# Patient Record
Sex: Female | Born: 1985 | Race: Black or African American | Hispanic: No | Marital: Single | Smoking: Never smoker
Health system: Southern US, Community
[De-identification: ages and names within clinical notes are randomized; demographics above are authoritative.]

## PROBLEM LIST (undated history)

## (undated) ENCOUNTER — Emergency Department (HOSPITAL_COMMUNITY): Admission: EM | Payer: Self-pay | Source: Home / Self Care

## (undated) DIAGNOSIS — G43909 Migraine, unspecified, not intractable, without status migrainosus: Secondary | ICD-10-CM

## (undated) DIAGNOSIS — T7840XA Allergy, unspecified, initial encounter: Secondary | ICD-10-CM

## (undated) DIAGNOSIS — L0291 Cutaneous abscess, unspecified: Secondary | ICD-10-CM

## (undated) HISTORY — PX: WISDOM TOOTH EXTRACTION: SHX21

## (undated) HISTORY — DX: Migraine, unspecified, not intractable, without status migrainosus: G43.909

## (undated) HISTORY — DX: Allergy, unspecified, initial encounter: T78.40XA

---

## 2006-03-30 ENCOUNTER — Emergency Department (HOSPITAL_COMMUNITY): Admission: EM | Admit: 2006-03-30 | Discharge: 2006-03-30 | Payer: Self-pay | Admitting: Family Medicine

## 2010-04-05 ENCOUNTER — Emergency Department (HOSPITAL_COMMUNITY): Admission: EM | Admit: 2010-04-05 | Discharge: 2010-04-05 | Payer: Self-pay | Admitting: Emergency Medicine

## 2011-01-24 LAB — URINALYSIS, ROUTINE W REFLEX MICROSCOPIC
Bilirubin Urine: NEGATIVE
Nitrite: NEGATIVE

## 2011-01-24 LAB — WET PREP, GENITAL
Trich, Wet Prep: NONE SEEN
Yeast Wet Prep HPF POC: NONE SEEN

## 2011-01-24 LAB — POCT PREGNANCY, URINE: Preg Test, Ur: NEGATIVE

## 2011-05-10 IMAGING — US US PELVIS COMPLETE
1 series · 14 of 25 positions shown · non-contrast
Comparison: None

CLINICAL DATA: Nausea, vomiting, lower abdominal pain.

TRANSABDOMINAL ULTRASOUND OF PELVIS
TECHNIQUE: Transabdominal ultrasound examination of the pelvis was
performed including evaluation of the uterus, ovaries, adnexal
regions, and pelvic cul-de-sac.

[Series 1: us pelvis complete · 0.23mm/px · 14 of 36 slices shown]
[im 1/36]
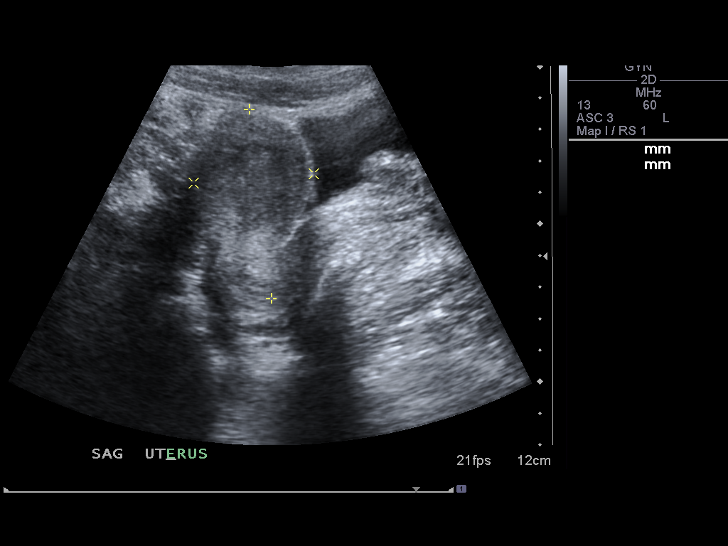
[im 3/36]
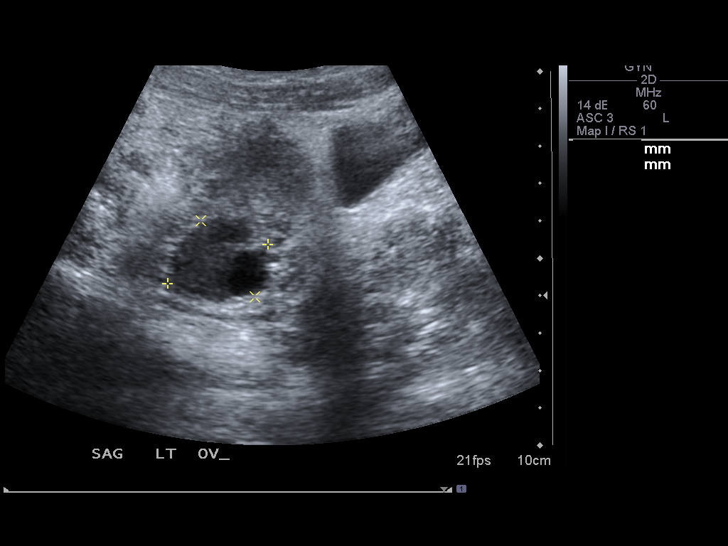
[im 6/36]
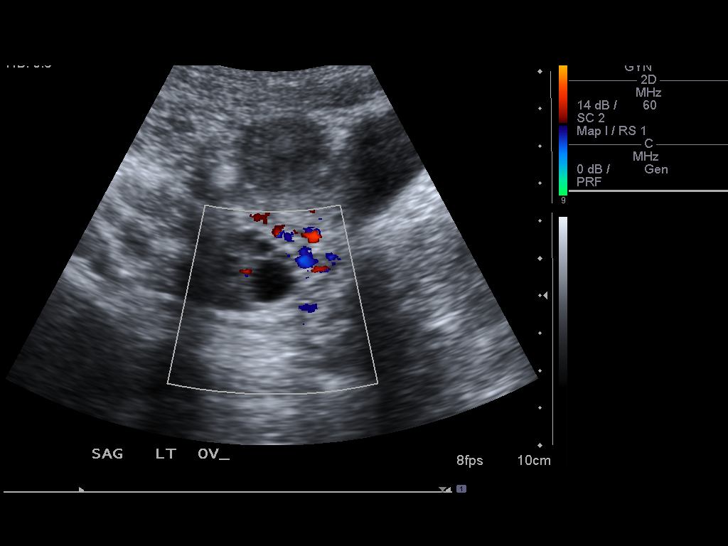
[im 9/36]
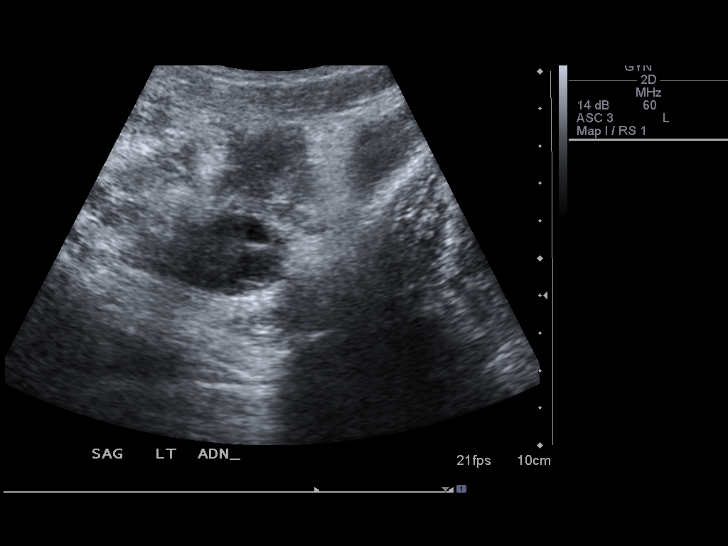
[im 12/36]
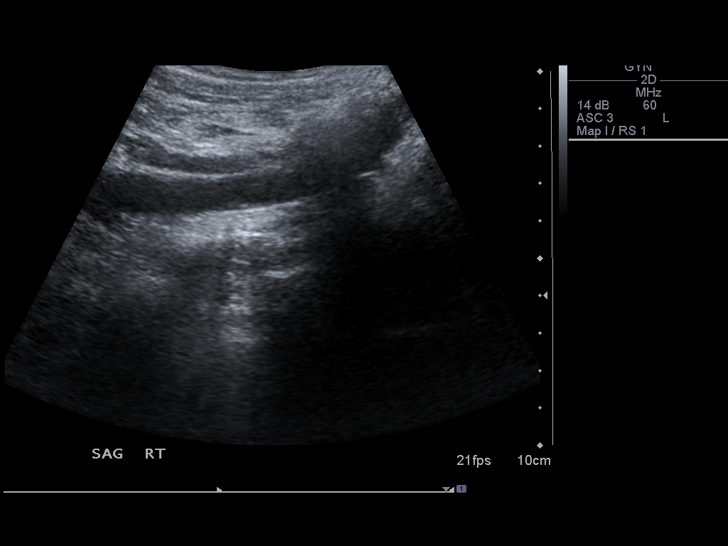
[im 14/36]
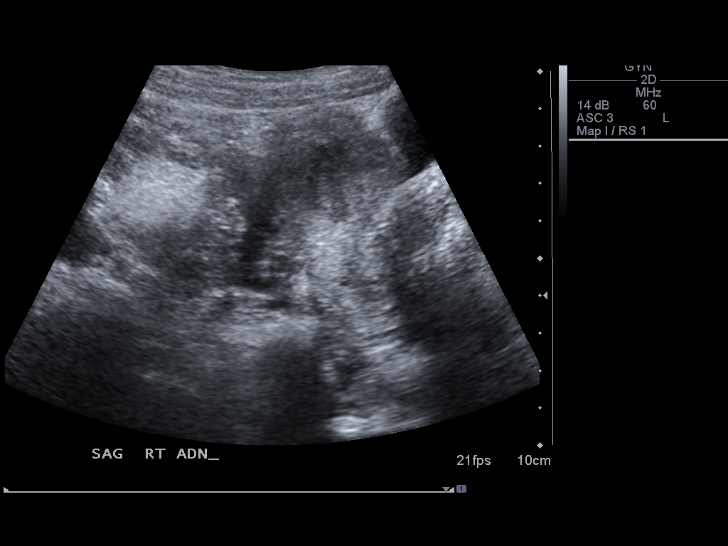
[im 17/36]
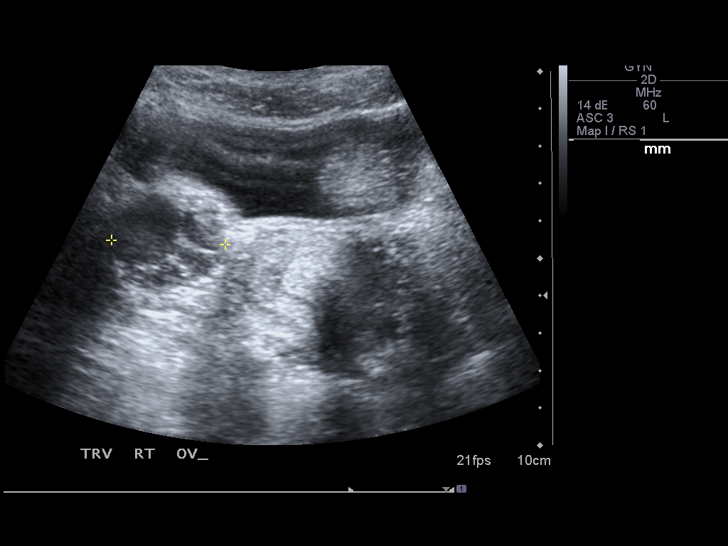
[im 19/36]
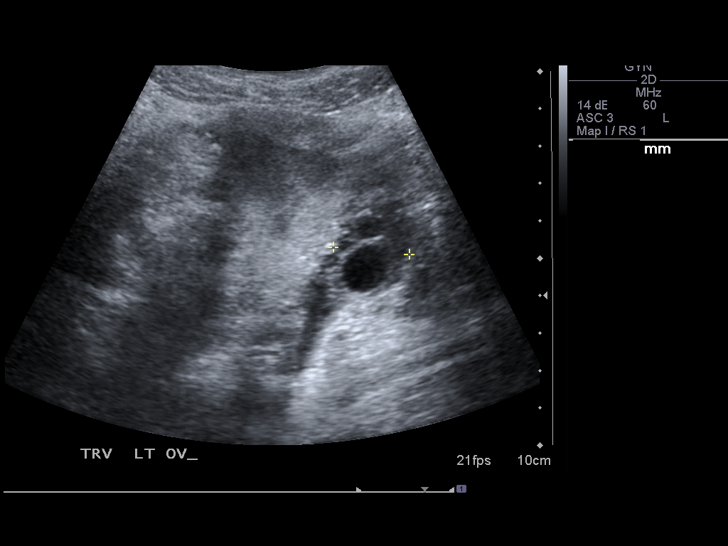
[im 22/36]
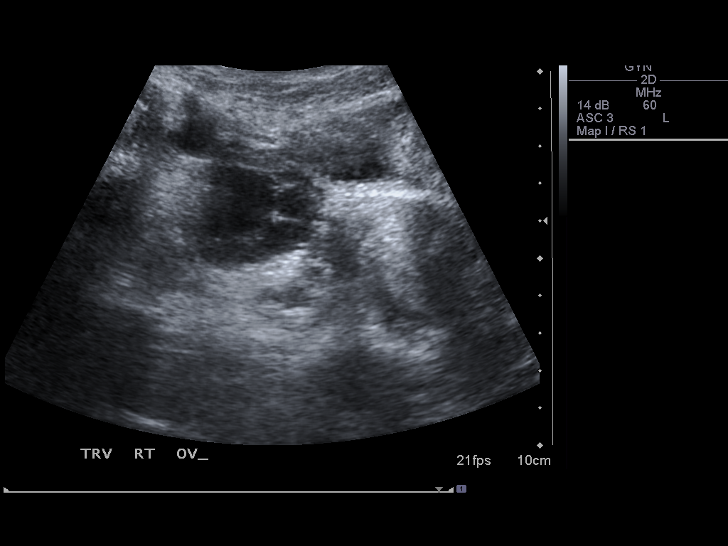
[im 24/36]
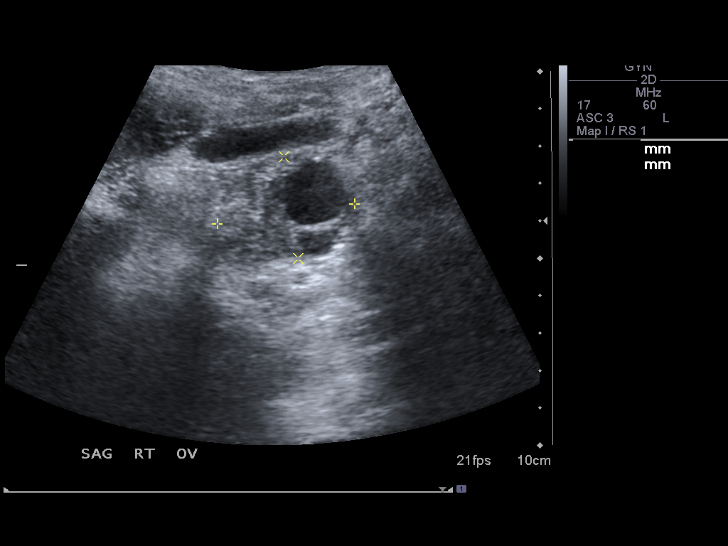
[im 27/36]
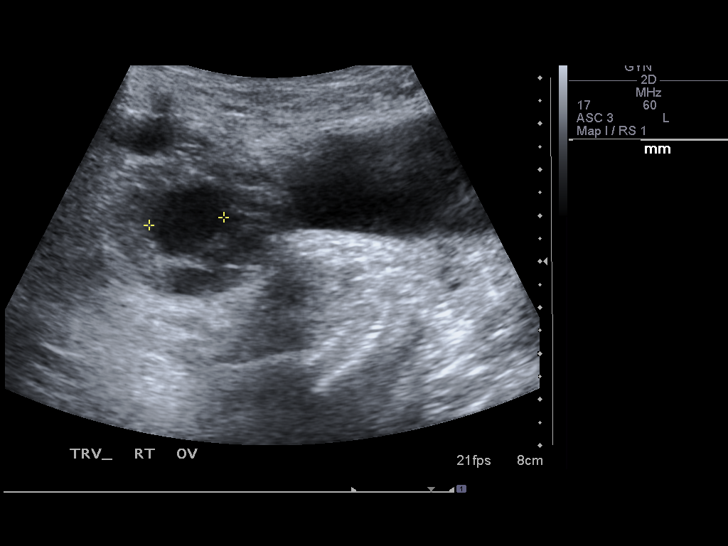
[im 30/36]
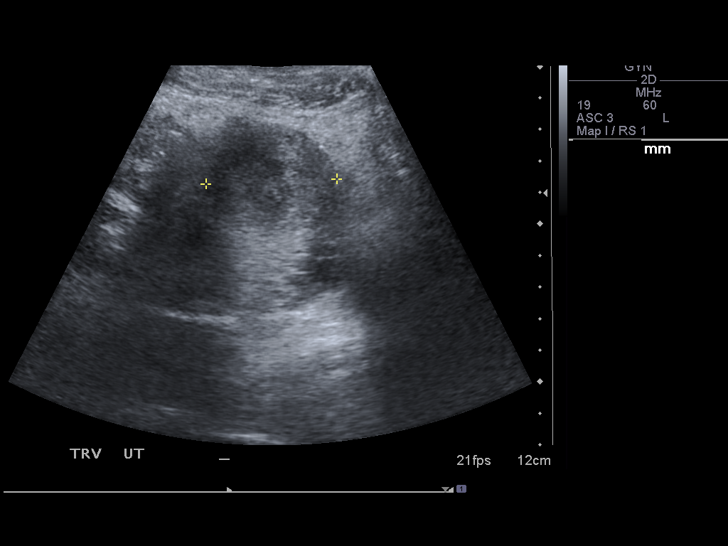
[im 33/36]
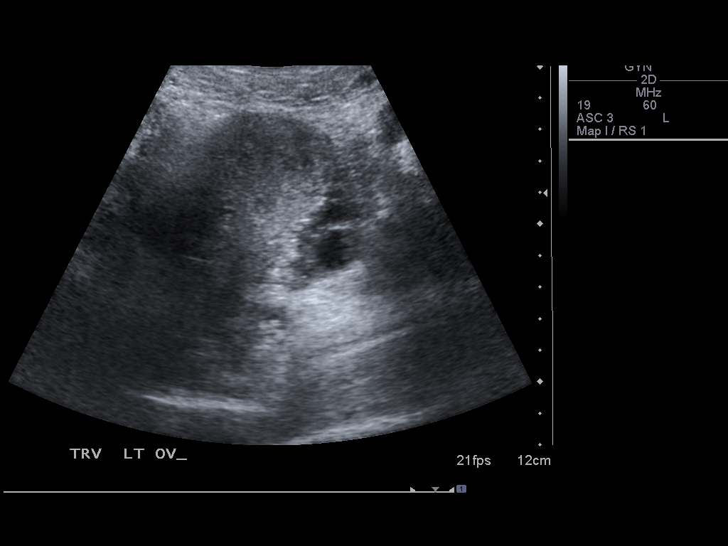
[im 36/36]
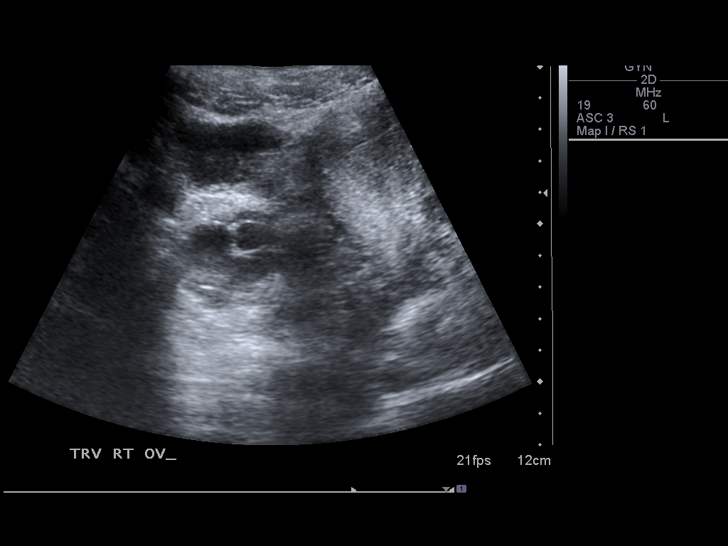

[14 of 25 positions shown; findings below may reference images not displayed]

FINDINGS: Uterus: 6.0 x 3.8 x 4.4 cm.  Normal echotexture.  No focal
abnormality.

Endometrium: Normal in appearance and thickness, 3 mm.

Right Ovary: Normal size and echotexture.  No adnexal masses. Small
follicles present.

Left Ovary: Normal size and echotexture.  No adnexal masses. Small
follicles are present.

Other Findings:  No free fluid.
IMPRESSION: Unremarkable transabdominal pelvic ultrasound.  Small bilateral
ovarian follicles present.

## 2012-09-26 LAB — HM PAP SMEAR: HM Pap smear: NORMAL

## 2014-09-26 ENCOUNTER — Encounter: Payer: Self-pay | Admitting: Physician Assistant

## 2014-09-26 ENCOUNTER — Telehealth: Payer: Self-pay | Admitting: Physician Assistant

## 2014-09-26 ENCOUNTER — Ambulatory Visit (INDEPENDENT_AMBULATORY_CARE_PROVIDER_SITE_OTHER): Payer: Managed Care, Other (non HMO) | Admitting: Physician Assistant

## 2014-09-26 VITALS — BP 110/78 | HR 91 | Temp 98.2°F | Resp 16 | Ht 62.5 in | Wt 101.0 lb

## 2014-09-26 DIAGNOSIS — N949 Unspecified condition associated with female genital organs and menstrual cycle: Secondary | ICD-10-CM

## 2014-09-26 DIAGNOSIS — H9201 Otalgia, right ear: Secondary | ICD-10-CM

## 2014-09-26 MED ORDER — ANTIPYRINE-BENZOCAINE 5.4-1.4 % OT SOLN: 3.0000 [drp] | OTIC | Status: DC | PRN

## 2014-09-26 NOTE — Progress Notes (Signed)
Pre visit review using our clinic review tool, if applicable. No additional management support is needed unless otherwise documented below in the visit note. 

## 2014-09-26 NOTE — Telephone Encounter (Signed)
Note from pharmacy that Auralgan has been discontinued.  She should continue current regimen discussed today. Can get OTC ear numbing drops to help.

## 2014-09-26 NOTE — Telephone Encounter (Signed)
Pt.notified

## 2014-09-26 NOTE — Patient Instructions (Signed)
Please take a daily Claritin each morning and either a Zyrtec of Benadryl at bedtime over the next week.  Resume your Nasocort.  After this week, take a Claritin and use your Nasocort daily.  Follow-up in 1 month.  Return sooner if needed.  Barotitis Media Barotitis media is inflammation of your middle ear. This occurs when the auditory tube (eustachian tube) leading from the back of your nose (nasopharynx) to your eardrum is blocked. This blockage may result from a cold, environmental allergies, or an upper respiratory infection. Unresolved barotitis media may lead to damage or hearing loss (barotrauma), which may become permanent. HOME CARE INSTRUCTIONS   Use medicines as recommended by your health care provider. Over-the-counter medicines will help unblock the canal and can help during times of air travel.  Do not put anything into your ears to clean or unplug them. Eardrops will not be helpful.  Do not swim, dive, or fly until your health care provider says it is all right to do so. If these activities are necessary, chewing gum with frequent, forceful swallowing may help. It is also helpful to hold your nose and gently blow to pop your ears for equalizing pressure changes. This forces air into the eustachian tube.  Only take over-the-counter or prescription medicines for pain, discomfort, or fever as directed by your health care provider.  A decongestant may be helpful in decongesting the middle ear and make pressure equalization easier. SEEK MEDICAL CARE IF:  You experience a serious form of dizziness in which you feel as if the room is spinning and you feel nauseated (vertigo).  Your symptoms only involve one ear. SEEK IMMEDIATE MEDICAL CARE IF:   You develop a severe headache, dizziness, or severe ear pain.  You have bloody or pus-like drainage from your ears.  You develop a fever.  Your problems do not improve or become worse. MAKE SURE YOU:   Understand these  instructions.  Will watch your condition.  Will get help right away if you are not doing well or get worse. Document Released: 10/21/2000 Document Revised: 08/14/2013 Document Reviewed: 05/21/2013 [facility] Patient Information 2015 [place], [place]. This information is not intended to replace advice given to you by your health care provider. Make sure you discuss any questions you have with your health care provider.

## 2014-09-26 NOTE — Progress Notes (Signed)
    Patient presents to clinic today to establish care.  Acute Concerns: Patient c/o recurrent episodes of right otalgia over the past year.  Has been treated previously for AOM and for Eustachian tube dysfunction.  Denies hx of seasonal allergies.  Endorses mild pressure of right ear with popping sensation.  Is not taking anything for symptoms.  Denies decreased hearing, tinnitus or drainage from ear.  Denies sinus pressure, congestion or other URI symptoms.  Has previously seen ENT but could not find a cause of symptoms.  Health Maintenance: Dental --up-to-date Vision -- up-to-date Immunizations -- Declines flu shot. PAP -- 2013; No abnormal findings.  Past Medical History  Diagnosis Date  . Allergy   . Migraines     Past Surgical History  Procedure Laterality Date  . Wisdom tooth extraction      No current outpatient prescriptions on file prior to visit.   No current facility-administered medications on file prior to visit.    Not on File  Family History  Problem Relation Age of Onset  . Hypertension Mother   . Thyroid disease Mother   . Diabetes Maternal Grandmother     History   Social History  . Marital Status: Single    Spouse Name: N/A    Number of Children: N/A  . Years of Education: N/A   Occupational History  . Not on file.   Social History Main Topics  . Smoking status: Never Smoker   . Smokeless tobacco: Not on file  . Alcohol Use: No  . Drug Use: No  . Sexual Activity: No   Other Topics Concern  . Not on file   Social History Narrative  . No narrative on file   ROS See HPI.  All other ROS are negative.  BP 110/78 mmHg  Pulse 91  Temp(Src) 98.2 F (36.8 C) (Oral)  Resp 16  Ht 5' 2.5" (1.588 m)  Wt 101 lb (45.813 kg)  BMI 18.17 kg/m2  SpO2 98%  Physical Exam  Constitutional: She is oriented to person, place, and time and well-developed, well-nourished, and in no distress.  HENT:  Head: Normocephalic and atraumatic.  Right Ear:  External ear and ear canal normal. Tympanic membrane is not erythematous, not retracted and not bulging. A middle ear effusion is present.  Left Ear: External ear and ear canal normal. Tympanic membrane is not erythematous, not retracted and not bulging.  No middle ear effusion.  Eyes: Conjunctivae are normal. Pupils are equal, round, and reactive to light.  Neck: Neck supple.  Cardiovascular: Normal rate, regular rhythm, normal heart sounds and intact distal pulses.   Pulmonary/Chest: Effort normal and breath sounds normal. No respiratory distress. She has no wheezes. She has no rales. She exhibits no tenderness.  Lymphadenopathy:    She has no cervical adenopathy.  Neurological: She is alert and oriented to person, place, and time.  Skin: Skin is warm and dry. No rash noted.  Psychiatric: Affect normal.  Vitals reviewed.  Assessment/Plan: No problem-specific assessment & plan notes found for this encounter.

## 2014-09-29 DIAGNOSIS — H9201 Otalgia, right ear: Secondary | ICD-10-CM | POA: Insufficient documentation

## 2014-09-29 NOTE — Assessment & Plan Note (Signed)
Seems consistent with Eustachian tube dysfunction.  Recommended Claritin-D over the next 1-2 weeks with Flonase.  Then daily plain Claritin or Zyrtec and daily Flonase.  Follow-up with ENT if symptoms persist.

## 2014-09-30 ENCOUNTER — Telehealth: Payer: Self-pay | Admitting: Physician Assistant

## 2014-09-30 NOTE — Telephone Encounter (Signed)
Then stop Benadryl and Claritin -D and try plain non-drowsy claritin. Again, no sign of infection on exam.  If symptoms persist, she needs to let us set her up with ENT.

## 2014-09-30 NOTE — Telephone Encounter (Signed)
Caller name: Evonnie Daweshomas, Azaylah Relation to pt: self  Call back number: [phone] mobile or work # [phone]   Reason for call:   Pt states claritin and benadryl are making her drowsy.  Pt states she did not pick up rx antipyrine-benzocaine (AURALGAN) otic solution because its for pain and her ears are feeling stuffy and she's experiencing head aches.

## 2014-10-01 NOTE — Telephone Encounter (Signed)
Called and spoke with the pt and informed her of the note below.  Pt verbalized understanding and agreed.//AB/CMA 

## 2014-10-08 ENCOUNTER — Telehealth: Payer: Self-pay | Admitting: Physician Assistant

## 2014-10-08 DIAGNOSIS — H9203 Otalgia, bilateral: Secondary | ICD-10-CM

## 2014-10-08 NOTE — Telephone Encounter (Signed)
Referral has been placed. Please inform patient she will be contacted for evaluation.

## 2014-10-08 NOTE — Telephone Encounter (Signed)
Caller name: Vinnie Levelsharah Relation to pt: self Call back number: [phone] Pharmacy:  Reason for call:   Patient is not feeling any better since last visit and would like to be referred to an ENT for the fluid in her ears.

## 2014-10-13 NOTE — Telephone Encounter (Signed)
Pt has appointment dec 16th

## 2014-10-22 ENCOUNTER — Telehealth: Payer: Self-pay | Admitting: Physician Assistant

## 2014-10-22 NOTE — Telephone Encounter (Signed)
Caller name:[facility] Relation to WG:NFAOpt:none Call back number:[phone] Pharmacy:  Reason for call: [facility] pt was a no show for her appt on 10/15/14, states she did call her to see if she could reschedule

## 2017-05-07 ENCOUNTER — Emergency Department (HOSPITAL_COMMUNITY)
Admission: EM | Admit: 2017-05-07 | Discharge: 2017-05-07 | Disposition: A | Discharge status: Home / Self Care | Payer: Managed Care, Other (non HMO) | Attending: Emergency Medicine | Admitting: Emergency Medicine

## 2017-05-07 ENCOUNTER — Encounter (HOSPITAL_COMMUNITY): Payer: Self-pay | Admitting: Nurse Practitioner

## 2017-05-07 DIAGNOSIS — L0291 Cutaneous abscess, unspecified: Secondary | ICD-10-CM

## 2017-05-07 DIAGNOSIS — L02211 Cutaneous abscess of abdominal wall: Secondary | ICD-10-CM | POA: Insufficient documentation

## 2017-05-07 DIAGNOSIS — Z23 Encounter for immunization: Secondary | ICD-10-CM | POA: Insufficient documentation

## 2017-05-07 DIAGNOSIS — L02212 Cutaneous abscess of back [any part, except buttock]: Secondary | ICD-10-CM | POA: Insufficient documentation

## 2017-05-07 DIAGNOSIS — L02413 Cutaneous abscess of right upper limb: Secondary | ICD-10-CM | POA: Insufficient documentation

## 2017-05-07 MED ORDER — TETANUS-DIPHTH-ACELL PERTUSSIS 5-2.5-18.5 LF-MCG/0.5 IM SUSP
0.5000 mL | Freq: Once | INTRAMUSCULAR | Status: AC
  Administered 2017-05-07: 0.5 mL via INTRAMUSCULAR
  Filled 2017-05-07: qty 0.5

## 2017-05-07 MED ORDER — LIDOCAINE-EPINEPHRINE (PF) 2 %-1:200000 IJ SOLN
20.0000 mL | Freq: Once | INTRAMUSCULAR | Status: AC
  Administered 2017-05-07: 20 mL
  Filled 2017-05-07: qty 20

## 2017-05-07 NOTE — ED Provider Notes (Signed)
[facility]-EMERGENCY DEPT Provider Note   CSN: [id number] Arrival date & time: 05/07/17  1649  By signing my name below, I, Vista Mink, attest that this documentation has been prepared under the direction and in the presence of Jasmeen Fritsch PA-C.  Electronically Signed: Vista Mink, ED Scribe. 05/07/17. 6:22 PM.  History   Chief Complaint Chief Complaint  Patient presents with  . Abscess    HPI HPI Comments: Margaret Benjamin is a 31 y.o. female who presents to the Emergency Department complaining of a moderate, gradually worsening area of pain and swelling to the right forearm, chest, and back. Pt has had an abscess on her back for the past two months which has been constant and unchanged. She noticed the one on her chest forming 3 weeks ago and states that she noticed purulent bloody drainage from the area last week. Within the past 7 days, pt has noticed a similar area forming on her right forearm and states that it has been gradually worsening over the past few days. She has taken Aleve with only mild relief. Pt has also used hot compresses but hasn't noticed any improvement. Pt states pain is exacerbated with palpation and direct pressure. Pt is not currently taking abx. She is not immunocompromised, and denies taking immunosuppressants or steroids. She denies any fever or chills.   The history is provided by the patient. No language interpreter was used.    Past Medical History:  Diagnosis Date  . Allergy   . Migraines     Patient Active Problem List   Diagnosis Date Noted  . Otalgia of right ear 09/29/2014    Past Surgical History:  Procedure Laterality Date  . WISDOM TOOTH EXTRACTION      OB History    No data available       Home Medications    Prior to Admission medications   Medication Sig Start Date End Date Taking? Authorizing Provider  antipyrine-benzocaine Lyla Son) otic solution Place 3-4 drops into the right ear every 2 (two) hours as needed for ear pain.  09/26/14   Waldon Merl, PA-C    Family History Family History  Problem Relation Age of Onset  . Hypertension Mother   . Thyroid disease Mother   . Diabetes Maternal Grandmother     Social History Social History  Substance Use Topics  . Smoking status: Never Smoker  . Smokeless tobacco: Never Used  . Alcohol use No     Allergies   Patient has no known allergies.   Review of Systems Review of Systems  Constitutional: Negative for chills and fever.  Skin: Positive for wound (lesions to chest, back and right forearm).  Allergic/Immunologic: Negative for immunocompromised state.    Physical Exam Updated Vital Signs BP (!) 142/92   Pulse 89   Temp 98.7 F (37.1 C) (Oral)   Resp 14   SpO2 100%   Physical Exam  Constitutional: She is oriented to person, place, and time. She appears well-developed and well-nourished. No distress.  HENT:  Head: Normocephalic and atraumatic.  Neck: Normal range of motion.  Pulmonary/Chest: Effort normal.  Neurological: She is alert and oriented to person, place, and time.  Skin: Skin is warm and dry. She is not diaphoretic. There is erythema.  Lesion of the right forearm, about 1inch in diameter. Area is fluctuant and has an obvious head. There is no obvious surrounding cellulitis. No streaking noted. It is not draining.  Lesion on right lower back about 1cm in diameter. Fluctuant  with well defined borders. No surrounding cellulitis. No drainage. Lesion on the upper abdomen. Signs that lesion was previously drained and is healing at this time. No drainage currently. No obvious surrounding cellulitis.   Psychiatric: She has a normal mood and affect. Judgment normal.  Nursing note and vitals reviewed.    ED Treatments / Results  DIAGNOSTIC STUDIES: Oxygen Saturation is 100% on RA, normal by my interpretation.  COORDINATION OF CARE:  Labs (all labs ordered are listed, but only abnormal results are displayed) Labs Reviewed - No  data to display  EKG  EKG Interpretation None       Radiology No results found.  Procedures .Marland Kitchen.Incision and Drainage Date/Time: 05/07/2017 7:00 AM Performed by: Alveria ApleyACCAVALE, Lavaughn Haberle Authorized by: Alveria ApleyACCAVALE, Edessa Jakubowicz   Consent:    Consent obtained:  Verbal   Consent given by:  Patient   Risks discussed:  Bleeding, infection and pain   Alternatives discussed:  No treatment Location:    Type:  Abscess   Size:  1 in diam on arm, 1 cm diam on back Pre-procedure details:    Skin preparation:  Betadine Anesthesia (see MAR for exact dosages):    Anesthesia method:  Local infiltration   Local anesthetic:  Lidocaine 2% WITH epi Procedure type:    Complexity:  Simple Procedure details:    Incision types:  Stab incision   Incision depth:  Dermal   Scalpel blade:  11   Wound management:  Probed and deloculated and irrigated with saline   Drainage:  Purulent   Drainage amount: moderate from arm, scant from back.   Wound treatment:  Wound left open   Packing materials:  None Post-procedure details:    Patient tolerance of procedure:  Tolerated well, no immediate complications   (including critical care time)  Medications Ordered in ED Medications  lidocaine-EPINEPHrine (XYLOCAINE W/EPI) 2 %-1:200000 (PF) injection 20 mL (20 mLs Infiltration Given 05/07/17 1827)  Tdap (BOOSTRIX) injection 0.5 mL (0.5 mLs Intramuscular Given 05/07/17 1826)     Initial Impression / Assessment and Plan / ED Course  I have reviewed the triage vital signs and the nursing notes.  Pertinent labs & imaging results that were available during my care of the patient were reviewed by me and considered in my medical decision making (see chart for details).     Patient with recent history of draining abscess on chest, and lesions on back and arm. They have both fluctuant with well-defined borders and obvious heads. Will do incision and drainage on both sites. The lesion on the chest appears to have early drained,  and would not benefit from I&D at this time. Patient states that at this time, she does not need anything for pain control.  I&D was successful, and purulent drainage was expressed from both lesions. Neither lesion has signs of cellulitis, and patient is not currently experiencing any fever or chills. At this time I do not believe she needs antibiotics, and patient states that she would prefer not to take antibiotics if not necessary. Discussed the importance of follow-up with primary care, as patient would like to know why she is developing these abscesses all of a sudden. Patient states that she has a primary care provider, and she will contact them to schedule an appointment in the next week. Return precautions given. Patient states she understands and agrees to plan.  Final Clinical Impressions(s) / ED Diagnoses   Final diagnoses:  Abscess    New Prescriptions New Prescriptions   No medications  on file  I personally performed the services described in this documentation, which was scribed in my presence. The recorded information has been reviewed and is accurate.     Alveria Apley, PA-C 05/07/17 1956    Jacalyn Lefevre, MD 05/07/17 2010

## 2017-05-07 NOTE — ED Triage Notes (Signed)
Pt states she has been experiencing multiple skin abscess/boils. Currently has two on the torso and right forearm. Denies fever or chills.

## 2017-05-07 NOTE — Discharge Instructions (Signed)
Keep the incision sites clean. You may wash gently with soap and water. There will continue to be some drainage, but this should lessen each day. You may use Tylenol or ibuprofen as needed for pain control. Follow-up with your primary care provider regarding etiology of these abscesses. Return to the emergency department if you develop fever, chills, worsening pain or any new or worsening symptoms.

## 2017-07-18 ENCOUNTER — Encounter (HOSPITAL_COMMUNITY): Payer: Self-pay | Admitting: Emergency Medicine

## 2017-07-18 ENCOUNTER — Emergency Department (HOSPITAL_COMMUNITY)
Admission: EM | Admit: 2017-07-18 | Discharge: 2017-07-18 | Disposition: A | Discharge status: Home / Self Care | Payer: Managed Care, Other (non HMO) | Attending: Emergency Medicine | Admitting: Emergency Medicine

## 2017-07-18 DIAGNOSIS — J069 Acute upper respiratory infection, unspecified: Secondary | ICD-10-CM | POA: Insufficient documentation

## 2017-07-18 DIAGNOSIS — L989 Disorder of the skin and subcutaneous tissue, unspecified: Secondary | ICD-10-CM | POA: Insufficient documentation

## 2017-07-18 MED ORDER — SULFAMETHOXAZOLE-TRIMETHOPRIM 800-160 MG PO TABS: 1.0000 | ORAL_TABLET | Freq: Two times a day (BID) | ORAL | 0 refills | Status: AC

## 2017-07-18 MED ORDER — PSEUDOEPHEDRINE HCL 30 MG PO TABS: 30.0000 mg | ORAL_TABLET | Freq: Four times a day (QID) | ORAL | 0 refills | Status: DC | PRN

## 2017-07-18 NOTE — ED Triage Notes (Signed)
Patient here from home. Reports have abscesses drained on bilateral arms back in July. States that they have returned. Also complains of generalized body aches.

## 2017-07-18 NOTE — ED Provider Notes (Signed)
[facility]PT Provider Note   CSN: [id number] Arrival date & time: 07/18/17  1945     History   Chief Complaint Chief Complaint  Patient presents with  . Abscess  . Generalized Body Aches    HPI Evonnie DawesSharah Grabel is a 31 y.o. female who presents to the ED for possible abscess to the right forearm. Patient reports that a while back she came in and had an abscess on her forearm and chest that were I&D. There area on her forearm right now feels under the skin and is small but that is how it was before. Patient states she would like antibiotics before it gets worse. Patient also reports having nasal congestion and feeling stopped up on the right side of her head and right ear.   HPI  Past Medical History:  Diagnosis Date  . Allergy   . Migraines     Patient Active Problem List   Diagnosis Date Noted  . Otalgia of right ear 09/29/2014    Past Surgical History:  Procedure Laterality Date  . WISDOM TOOTH EXTRACTION      OB History    No data available       Home Medications    Prior to Admission medications   Medication Sig Start Date End Date Taking? Authorizing Provider  antipyrine-benzocaine Lyla Son(AURALGAN) otic solution Place 3-4 drops into the right ear every 2 (two) hours as needed for ear pain. 09/26/14   Waldon MerlMartin, William C, PA-C  pseudoephedrine (SUDAFED) 30 MG tablet Take 1 tablet (30 mg total) by mouth every 6 (six) hours as needed for congestion. 07/18/17   Janne NapoleonNeese, Kirtis Challis M, NP  sulfamethoxazole-trimethoprim (BACTRIM DS,SEPTRA DS) 800-160 MG tablet Take 1 tablet by mouth 2 (two) times daily. 07/18/17 07/25/17  Janne NapoleonNeese, Kamal Jurgens M, NP    Family History Family History  Problem Relation Age of Onset  . Hypertension Mother   . Thyroid disease Mother   . Diabetes Maternal Grandmother     Social History Social History  Substance Use Topics  . Smoking status: Never Smoker  . Smokeless tobacco: Never Used  . Alcohol use No     Allergies   Patient has no known  allergies.   Review of Systems Review of Systems  Constitutional: Negative for chills and fever.  HENT: Positive for congestion and sinus pressure. Negative for sore throat and trouble swallowing.   Eyes: Negative for redness.  Respiratory: Negative for shortness of breath.   Gastrointestinal: Negative for nausea and vomiting.  Musculoskeletal: Negative for back pain.  Skin:       Skin lump   Neurological: Negative for dizziness and headaches.  Psychiatric/Behavioral: The patient is not nervous/anxious.      Physical Exam Updated Vital Signs BP 130/80 (BP Location: Left Arm)   Pulse 87   Temp 98.5 F (36.9 C) (Oral)   Resp 20   SpO2 100%   Physical Exam  Constitutional: She is oriented to person, place, and time. She appears well-developed and well-nourished. No distress.  HENT:  Head: Normocephalic and atraumatic.  Right Ear: Tympanic membrane normal.  Left Ear: Tympanic membrane normal.  Nose: Right sinus exhibits maxillary sinus tenderness.  Mouth/Throat: Uvula is midline, oropharynx is clear and moist and mucous membranes are normal.  Eyes: EOM are normal.  Neck: Normal range of motion. Neck supple.  Cardiovascular: Normal rate and regular rhythm.   Pulmonary/Chest: Effort normal. No respiratory distress. She has no wheezes. She has no rales.  Musculoskeletal: Normal range of motion.  2 BB size raised area to the right forearm.  Lymphadenopathy:    She has no cervical adenopathy.  Neurological: She is alert and oriented to person, place, and time. No cranial nerve deficit.  Skin: Skin is warm and dry.  Psychiatric: She has a normal mood and affect. Her behavior is normal.  Nursing note and vitals reviewed.    ED Treatments / Results  Labs (all labs ordered are listed, but only abnormal results are displayed) Labs Reviewed - No data to display  Radiology No results found.  Procedures Procedures (including critical care time)  Medications Ordered in  ED Medications - No data to display   Initial Impression / Assessment and Plan / ED Course  I have reviewed the triage vital signs and the nursing notes.  31 y.o. female with URI and 2 small raised areas to the right forearm that patient states are similar to how her abscess started before stable for d/c without fever or other problems. Will start antibiotics and have patient soak the area. Will start decongestant for sinus congestion. Patient to f/u with dermatology if areas on forearm persist.   Final Clinical Impressions(s) / ED Diagnoses   Final diagnoses:  URI, acute  Skin lesion    New Prescriptions New Prescriptions   PSEUDOEPHEDRINE (SUDAFED) 30 MG TABLET    Take 1 tablet (30 mg total) by mouth every 6 (six) hours as needed for congestion.   SULFAMETHOXAZOLE-TRIMETHOPRIM (BACTRIM DS,SEPTRA DS) 800-160 MG TABLET    Take 1 tablet by mouth 2 (two) times daily.     Kerrie Buffalo [place], [place] 07/18/17 2125    Lorre Nick, MD 07/20/17 [phone]

## 2018-06-20 ENCOUNTER — Ambulatory Visit: Payer: Self-pay | Admitting: Nurse Practitioner

## 2018-06-20 VITALS — BP 95/60 | HR 76 | Temp 98.3°F | Resp 16 | Wt 102.2 lb

## 2018-06-20 DIAGNOSIS — J019 Acute sinusitis, unspecified: Secondary | ICD-10-CM

## 2018-06-20 MED ORDER — MONTELUKAST SODIUM 10 MG PO TABS: 10.0000 mg | ORAL_TABLET | Freq: Every day | ORAL | 0 refills | Status: DC

## 2018-06-20 MED ORDER — FLUTICASONE PROPIONATE 50 MCG/ACT NA SUSP: 2.0000 | Freq: Every day | NASAL | 0 refills | Status: DC

## 2018-06-20 MED ORDER — AMOXICILLIN-POT CLAVULANATE 875-125 MG PO TABS: 1.0000 | ORAL_TABLET | Freq: Two times a day (BID) | ORAL | 0 refills | Status: AC

## 2018-06-20 NOTE — Progress Notes (Signed)
Subjective:  Margaret DawesSharah Benjamin is a 32 y.o. female who presents for evaluation of possible sinusitis.  Symptoms include right ear pressure/pain, sinus pressure, sinus pain and right-sided frontal headache.  Onset of symptoms was 4 weeks ago, and has been gradually worsening since that time.  Treatment to date:  afrin.  High risk factors for influenza complications:  none.  The following portions of the patient's history were reviewed and updated as appropriate:  allergies, current medications and past medical history.  Constitutional: negative Eyes: negative Ears, nose, mouth, throat, and face: positive for nasal congestion and right ear fullness/pressure, scratchy throat, negative for ear drainage, earaches and hoarseness Respiratory: negative Cardiovascular: negative Gastrointestinal: negative Neurological: positive for right frontal headaches, negative for coordination problems, dizziness, paresthesia, seizures, speech problems, tremors, vertigo and weakness Allergic/Immunologic: positive for hay fever   Objective:  BP 95/60 (BP Location: Right Arm, Patient Position: Sitting, Cuff Size: Normal)   Pulse 76   Temp 98.3 F (36.8 C) (Oral)   Resp 16   Wt 102 lb 3.2 oz (46.4 kg)   SpO2 99%   BMI 18.39 kg/m  General appearance: alert, cooperative, fatigued and no distress Head: Normocephalic, without obvious abnormality, atraumatic Eyes: conjunctivae/corneas clear. PERRL, EOM's intact. Fundi benign. Ears: abnormal TM right ear - mucoid middle ear fluid Nose: no discharge, turbinates swollen, inflamed, no maxillary sinus tenderness bilateral, moderate frontal sinus tenderness right Throat: lips, mucosa, and tongue normal; teeth and gums normal Lungs: clear to auscultation bilaterally Heart: regular rate and rhythm, S1, S2 normal, no murmur, click, rub or gallop Abdomen: soft, non-tender; bowel sounds normal; no masses,  no organomegaly Pulses: 2+ and symmetric Skin: Skin color, texture,  turgor normal. No rashes or lesions Lymph nodes: cervical and submandibular nodes normal Neurologic: Grossly normal    Assessment:  Acute Sinusitis    Plan:  Exam findings, diagnosis etiology and medication use and indications reviewed with patient. Follow- Up and discharge instructions provided. No emergent/urgent issues found on exam.  Patient verbalized understanding of information provided and agrees with plan of care (POC), all questions answered.  1. Acute sinusitis, recurrence not specified, unspecified location  - amoxicillin-clavulanate (AUGMENTIN) 875-125 MG tablet; Take 1 tablet by mouth 2 (two) times daily for 10 days.  Dispense: 20 tablet; Refill: 0 - fluticasone (FLONASE) 50 MCG/ACT nasal spray; Place 2 sprays into both nostrils daily for 10 days.  Dispense: 16 g; Refill: 0 - montelukast (SINGULAIR) 10 MG tablet; Take 1 tablet (10 mg total) by mouth at bedtime.  Dispense: 30 tablet; Refill: 0 -Take medications as prescribed. -Stop use of Afrin immediately. -Ibuprofen or Tylenol for pain, fever or general discomfort. -Warm saltwater gargles for throat discomfort. -Follow up as needed.

## 2018-06-20 NOTE — Patient Instructions (Signed)
Sinusitis, Adult  -Take medications as prescribed. -Stop use of Afrin immediately. -Ibuprofen or Tylenol for pain, fever or general discomfort. -Warm saltwater gargles for throat discomfort. -Follow up as needed.   Sinusitis is soreness and inflammation of your sinuses. Sinuses are hollow spaces in the bones around your face. Your sinuses are located:  Around your eyes.  In the middle of your forehead.  Behind your nose.  In your cheekbones.  Your sinuses and nasal passages are lined with a stringy fluid (mucus). Mucus normally drains out of your sinuses. When your nasal tissues become inflamed or swollen, the mucus can become trapped or blocked so air cannot flow through your sinuses. This allows bacteria, viruses, and funguses to grow, which leads to infection. Sinusitis can develop quickly and last for 7?10 days (acute) or for more than 12 weeks (chronic). Sinusitis often develops after a cold. What are the causes? This condition is caused by anything that creates swelling in the sinuses or stops mucus from draining, including:  Allergies.  Asthma.  Bacterial or viral infection.  Abnormally shaped bones between the nasal passages.  Nasal growths that contain mucus (nasal polyps).  Narrow sinus openings.  Pollutants, such as chemicals or irritants in the air.  A foreign object stuck in the nose.  A fungal infection. This is rare.  What increases the risk? The following factors may make you more likely to develop this condition:  Having allergies or asthma.  Having had a recent cold or respiratory tract infection.  Having structural deformities or blockages in your nose or sinuses.  Having a weak immune system.  Doing a lot of swimming or diving.  Overusing nasal sprays.  Smoking.  What are the signs or symptoms? The main symptoms of this condition are pain and a feeling of pressure around the affected sinuses. Other symptoms include:  Upper  toothache.  Earache.  Headache.  Bad breath.  Decreased sense of smell and taste.  A cough that may get worse at night.  Fatigue.  Fever.  Thick drainage from your nose. The drainage is often green and it may contain pus (purulent).  Stuffy nose or congestion.  Postnasal drip. This is when extra mucus collects in the throat or back of the nose.  Swelling and warmth over the affected sinuses.  Sore throat.  Sensitivity to light.  How is this diagnosed? This condition is diagnosed based on symptoms, a medical history, and a physical exam. To find out if your condition is acute or chronic, your health care provider may:  Look in your nose for signs of nasal polyps.  Tap over the affected sinus to check for signs of infection.  View the inside of your sinuses using an imaging device that has a light attached (endoscope).  If your health care provider suspects that you have chronic sinusitis, you may also:  Be tested for allergies.  Have a sample of mucus taken from your nose (nasal culture) and checked for bacteria.  Have a mucus sample examined to see if your sinusitis is related to an allergy.  If your sinusitis does not respond to treatment and it lasts longer than 8 weeks, you may have an MRI or CT scan to check your sinuses. These scans also help to determine how severe your infection is. In rare cases, a bone biopsy may be done to rule out more serious types of fungal sinus disease. How is this treated? Treatment for sinusitis depends on the cause and whether your condition  is chronic or acute. If a virus is causing your sinusitis, your symptoms will go away on their own within 10 days. You may be given medicines to relieve your symptoms, including:  Topical nasal decongestants. They shrink swollen nasal passages and let mucus drain from your sinuses.  Antihistamines. These drugs block inflammation that is triggered by allergies. This can help to ease swelling in  your nose and sinuses.  Topical nasal corticosteroids. These are nasal sprays that ease inflammation and swelling in your nose and sinuses.  Nasal saline washes. These rinses can help to get rid of thick mucus in your nose.  If your condition is caused by bacteria, you will be given an antibiotic medicine. If your condition is caused by a fungus, you will be given an antifungal medicine. Surgery may be needed to correct underlying conditions, such as narrow nasal passages. Surgery may also be needed to remove polyps. Follow these instructions at home: Medicines  Take, use, or apply over-the-counter and prescription medicines only as told by your health care provider. These may include nasal sprays.  If you were prescribed an antibiotic medicine, take it as told by your health care provider. Do not stop taking the antibiotic even if you start to feel better. Hydrate and Humidify  Drink enough water to keep your urine clear or pale yellow. Staying hydrated will help to thin your mucus.  Use a cool mist humidifier to keep the humidity level in your home above 50%.  Inhale steam for 10-15 minutes, 3-4 times a day or as told by your health care provider. You can do this in the bathroom while a hot shower is running.  Limit your exposure to cool or dry air. Rest  Rest as much as possible.  Sleep with your head raised (elevated).  Make sure to get enough sleep each night. General instructions  Apply a warm, moist washcloth to your face 3-4 times a day or as told by your health care provider. This will help with discomfort.  Wash your hands often with soap and water to reduce your exposure to viruses and other germs. If soap and water are not available, use hand sanitizer.  Do not smoke. Avoid being around people who are smoking (secondhand smoke).  Keep all follow-up visits as told by your health care provider. This is important. Contact a health care provider if:  You have a  fever.  Your symptoms get worse.  Your symptoms do not improve within 10 days. Get help right away if:  You have a severe headache.  You have persistent vomiting.  You have pain or swelling around your face or eyes.  You have vision problems.  You develop confusion.  Your neck is stiff.  You have trouble breathing. This information is not intended to replace advice given to you by your health care provider. Make sure you discuss any questions you have with your health care provider. Document Released: 10/24/2005 Document Revised: 06/19/2016 Document Reviewed: 08/19/2015 Elsevier Interactive Patient Education  [employer].

## 2019-04-30 ENCOUNTER — Encounter (INDEPENDENT_AMBULATORY_CARE_PROVIDER_SITE_OTHER): Payer: Self-pay | Admitting: Physician Assistant

## 2019-04-30 ENCOUNTER — Other Ambulatory Visit: Payer: Self-pay

## 2019-04-30 ENCOUNTER — Ambulatory Visit
Admission: EM | Admit: 2019-04-30 | Discharge: 2019-04-30 | Disposition: A | Discharge status: Home / Self Care | Payer: 59 | Attending: Physician Assistant | Admitting: Physician Assistant

## 2019-04-30 DIAGNOSIS — J3489 Other specified disorders of nose and nasal sinuses: Secondary | ICD-10-CM

## 2019-04-30 DIAGNOSIS — H6983 Other specified disorders of Eustachian tube, bilateral: Secondary | ICD-10-CM

## 2019-04-30 MED ORDER — AZELASTINE HCL 0.1 % NA SOLN: 2.0000 | Freq: Two times a day (BID) | NASAL | 3 refills | Status: DC

## 2019-04-30 MED ORDER — PREDNISONE 50 MG PO TABS: 50.0000 mg | ORAL_TABLET | Freq: Every day | ORAL | 0 refills | Status: DC

## 2019-04-30 NOTE — Discharge Instructions (Signed)
Start azelastine as directed. Prednisone as directed. Continue an allergy medicine as directed.   If starting to have worsening pain, more swelling, please call.

## 2019-04-30 NOTE — ED Triage Notes (Signed)
Patient complains of feeling of fluid in both ears, headache, and congestion X 3-4 weeks. Patient also complains of tongue swelling after using a new mouthwash X 2-3 days. Patient initially went to [facility] and was turned away.

## 2019-04-30 NOTE — ED Provider Notes (Signed)
[name]    CSN: [id number] Arrival date & time: 04/30/19  1529     History   Chief Complaint Chief Complaint  Patient presents with  . Allergies    HPI Margaret Benjamin is a 33 y.o. female.   33 year old female comes in for multiple complaints.  3-4 week history of sinus pressure, congestion, sinus headache, fluid in ears. Denies rhinorrhea, cough, sore throat. Has felt "swishing" in her ears at times. Denies ear pain, drainage. Denies fever, chills, night sweats. Denies body aches. She take flonase occasionally without relief. Denies sick contact. Never smoker.  2-3 day history of tongue swelling after using new mouthwash. States once noticed some tongue discomfort, she stopped new mouthwash immediately after sensation. She took benadrly with good relief. However, still have swollen sensation. Lip/throat swelling. Denies trouble breathing, trouble swallowing, tripoding, drooling, trismus. Denies hives/rashes.      Past Medical History:  Diagnosis Date  . Allergy   . Migraines     Patient Active Problem List   Diagnosis Date Noted  . Otalgia of right ear 09/29/2014    Past Surgical History:  Procedure Laterality Date  . WISDOM TOOTH EXTRACTION      OB History   No obstetric history on file.      Home Medications    Prior to Admission medications   Medication Sig Start Date End Date Taking? Authorizing Provider  antipyrine-benzocaine Toniann Fail) otic solution Place 3-4 drops into the right ear every 2 (two) hours as needed for ear pain. Patient not taking: Reported on 06/20/2018 09/26/14   Brunetta Jeans, PA-C  azelastine (ASTELIN) 0.1 % nasal spray Place 2 sprays into both nostrils 2 (two) times daily. Use in each nostril as directed 04/30/19   Tasia Catchings, [place] [address] V, PA-C  Multiple Vitamin (MULTIVITAMIN) tablet Take 1 tablet by mouth daily.    [provider]  predniSONE (DELTASONE) 50 MG tablet Take 1 tablet (50 mg total) by mouth daily with  breakfast. 04/30/19   Tasia Catchings, [place] V, PA-C  pseudoephedrine (SUDAFED) 30 MG tablet Take 1 tablet (30 mg total) by mouth every 6 (six) hours as needed for congestion. Patient not taking: Reported on 06/20/2018 07/18/17   Ashley Murrain, NP    Family History Family History  Problem Relation Age of Onset  . Hypertension Mother   . Thyroid disease Mother   . Diabetes Maternal Grandmother     Social History Social History   Tobacco Use  . Smoking status: Never Smoker  . Smokeless tobacco: Never Used  Substance Use Topics  . Alcohol use: No    Alcohol/week: 0.0 standard drinks  . Drug use: No     Allergies   Patient has no known allergies.   Review of Systems Review of Systems  Reason unable to perform ROS: See HPI as above.     Physical Exam Triage Vital Signs ED Triage Vitals [04/30/19 1541]  Enc Vitals Group     BP 128/85     Pulse Rate 76     Resp 12     Temp 98.1 F (36.7 C)     Temp Source Oral     SpO2 98 %     Weight      Benjamin      Head Circumference      Peak Flow      Pain Score 4     Pain Loc      Pain Edu?      Excl.  in [place]?    No data found.  Updated Vital Signs BP 128/85 (BP Location: Right Arm)   Pulse 76   Temp 98.1 F (36.7 C) (Oral)   Resp 12   LMP 04/22/2019   SpO2 98%   Physical Exam Constitutional:      General: She is not in acute distress.    Appearance: Normal appearance. She is not ill-appearing, toxic-appearing or diaphoretic.  HENT:     Head: Normocephalic and atraumatic.     Right Ear: Ear canal and external ear normal. A middle ear effusion is present. Tympanic membrane is not erythematous or bulging.     Left Ear: Ear canal and external ear normal. A middle ear effusion is present. Tympanic membrane is not erythematous or bulging.     Nose:     Right Sinus: No maxillary sinus tenderness or frontal sinus tenderness.     Left Sinus: No maxillary sinus tenderness or frontal sinus tenderness.     Mouth/Throat:     Mouth:  Mucous membranes are moist.     Pharynx: Oropharynx is clear. Uvula midline.     Comments: No oral swelling. No swelling to the tongue. Handling secretions well. Able to speak in full sentences without difficulty.  Neck:     Musculoskeletal: Normal range of motion and neck supple.  Cardiovascular:     Rate and Rhythm: Normal rate and regular rhythm.     Heart sounds: Normal heart sounds. No murmur. No friction rub. No gallop.   Pulmonary:     Effort: Pulmonary effort is normal. No accessory muscle usage, prolonged expiration, respiratory distress or retractions.     Comments: Lungs clear to auscultation without adventitious lung sounds. Neurological:     General: No focal deficit present.     Mental Status: She is alert and oriented to person, place, and time.    UC Treatments / Results  Labs (all labs ordered are listed, but only abnormal results are displayed) Labs Reviewed - No data to display  EKG None  Radiology No results found.  Procedures Procedures (including critical care time)  Medications Ordered in UC Medications - No data to display  Initial Impression / Assessment and Plan / UC Course  I have reviewed the triage vital signs and the nursing notes.  Pertinent labs & imaging results that were available during my care of the patient were reviewed by me and considered in my medical decision making (see chart for details).    No alarming signs on exam. Discussed eustachian tube dysfunction, and patient states had that in the past with good improvement using azelastine, will refill for patient to use. Prednisone for sinus pressure, and to cover for allergic reaction to mouthwash. Return precautions given. Patient expresses understanding and agrees to plan.  Final Clinical Impressions(s) / UC Diagnoses   Final diagnoses:  Sinus pressure  Dysfunction of both eustachian tubes   ED Prescriptions    Medication Sig Dispense Auth. Provider   azelastine (ASTELIN) 0.1  % nasal spray Place 2 sprays into both nostrils 2 (two) times daily. Use in each nostril as directed 30 mL Zaidee Rion V, PA-C   predniSONE (DELTASONE) 50 MG tablet Take 1 tablet (50 mg total) by mouth daily with breakfast. 5 tablet Threasa AlphaYu, Wilmar Prabhakar V, PA-C        Khushbu Pippen V, [place] JerseyPA-C 04/30/19 1703

## 2019-05-01 NOTE — Progress Notes (Signed)
This encounter was created in error - please disregard.

## 2020-04-12 ENCOUNTER — Other Ambulatory Visit: Payer: Self-pay

## 2020-04-12 ENCOUNTER — Ambulatory Visit
Admission: EM | Admit: 2020-04-12 | Discharge: 2020-04-12 | Disposition: A | Discharge status: Home / Self Care | Payer: Managed Care, Other (non HMO) | Attending: Emergency Medicine | Admitting: Emergency Medicine

## 2020-04-12 ENCOUNTER — Encounter: Payer: Self-pay | Admitting: *Deleted

## 2020-04-12 DIAGNOSIS — J3489 Other specified disorders of nose and nasal sinuses: Secondary | ICD-10-CM

## 2020-04-12 DIAGNOSIS — H6981 Other specified disorders of Eustachian tube, right ear: Secondary | ICD-10-CM

## 2020-04-12 MED ORDER — PREDNISONE 20 MG PO TABS: 40.0000 mg | ORAL_TABLET | Freq: Every day | ORAL | 0 refills | Status: AC

## 2020-04-12 NOTE — ED Provider Notes (Signed)
[name]    CSN: [id number] Arrival date & time: 04/12/20  1406      History   Chief Complaint Chief Complaint  Patient presents with  . Facial Pain    HPI Margaret Benjamin is a 34 y.o. female with history of allergies and migraines presenting for acute on chronic sinus pressure and right ear pain. Patient states she has history of seasonal sinusitis: Last seen for this at our urgent care in June 2020. Patient given azelastine, prednisone with successful relief. Denies fever, mucoid discharge, change in hearing, throat pain, cough, difficulty breathing, chest pain. No known sick contacts. Has not undergone Covid testing since symptom onset. States this exacerbation has been ongoing for greater than 2 weeks.   Past Medical History:  Diagnosis Date  . Allergy   . Migraines     Patient Active Problem List   Diagnosis Date Noted  . Otalgia of right ear 09/29/2014    Past Surgical History:  Procedure Laterality Date  . WISDOM TOOTH EXTRACTION      OB History   No obstetric history on file.      Home Medications    Prior to Admission medications   Medication Sig Start Date End Date Taking? Authorizing Provider  azelastine (ASTELIN) 0.1 % nasal spray Place 2 sprays into both nostrils 2 (two) times daily. Use in each nostril as directed 04/30/19  Yes Yu, Amy V, PA-C  pseudoephedrine (SUDAFED) 30 MG tablet Take 1 tablet (30 mg total) by mouth every 6 (six) hours as needed for congestion. 07/18/17  Yes Neese, [place], NP  antipyrine-benzocaine Toniann Fail) otic solution Place 3-4 drops into the right ear every 2 (two) hours as needed for ear pain. Patient not taking: Reported on 06/20/2018 09/26/14   Brunetta Jeans, PA-C  Multiple Vitamin (MULTIVITAMIN) tablet Take 1 tablet by mouth daily.    [provider]  predniSONE (DELTASONE) 20 MG tablet Take 2 tablets (40 mg total) by mouth daily with breakfast for 5 days. 04/12/20 04/17/20  Hall-Potvin, [place], PA-C      Family History Family History  Problem Relation Age of Onset  . Hypertension Mother   . Thyroid disease Mother   . Healthy Father   . Diabetes Maternal Grandmother     Social History Social History   Tobacco Use  . Smoking status: Never Smoker  . Smokeless tobacco: Never Used  Substance Use Topics  . Alcohol use: No  . Drug use: No     Allergies   Patient has no known allergies.   Review of Systems As per HPI   Physical Exam Triage Vital Signs ED Triage Vitals  Enc Vitals Group     BP      Pulse      Resp      Temp      Temp src      SpO2      Weight      Height      Head Circumference      Peak Flow      Pain Score      Pain Loc      Pain Edu?      Excl. in [place]?    No data found.  Updated Vital Signs BP (!) 141/93 Comment: taking oral decongestants  Pulse 78   Temp 98.4 F (36.9 C) (Oral)   Resp 18   LMP 03/30/2020 (Exact Date)   SpO2 97%   Visual Acuity Right Eye Distance:  Left Eye Distance:   Bilateral Distance:    Right Eye Near:   Left Eye Near:    Bilateral Near:     Physical Exam Constitutional:      General: She is not in acute distress. HENT:     Head: Normocephalic and atraumatic.     Jaw: There is normal jaw occlusion. No tenderness or pain on movement.     Right Ear: Hearing, tympanic membrane, ear canal and external ear normal. No tenderness. No mastoid tenderness.     Left Ear: Hearing, tympanic membrane, ear canal and external ear normal. No tenderness. No mastoid tenderness.     Ears:     Comments: Clear, serous fluid behind right TM without bulging or retraction.    Nose: No nasal deformity, septal deviation or nasal tenderness.     Right Turbinates: Not swollen or pale.     Left Turbinates: Not swollen or pale.     Right Sinus: No maxillary sinus tenderness or frontal sinus tenderness.     Left Sinus: No maxillary sinus tenderness or frontal sinus tenderness.     Comments: Bilateral turbinate edema with  mucosal pallor    Mouth/Throat:     Lips: Pink. No lesions.     Mouth: Mucous membranes are moist. No injury.     Pharynx: Oropharynx is clear. Uvula midline. No posterior oropharyngeal erythema or uvula swelling.     Comments: no tonsillar exudate or hypertrophy Eyes:     General: No scleral icterus.    Conjunctiva/sclera: Conjunctivae normal.     Pupils: Pupils are equal, round, and reactive to light.  Cardiovascular:     Rate and Rhythm: Normal rate.  Pulmonary:     Effort: Pulmonary effort is normal.  Musculoskeletal:     Cervical back: Normal range of motion and neck supple. No muscular tenderness.  Lymphadenopathy:     Cervical: No cervical adenopathy.  Neurological:     Mental Status: She is alert and oriented to person, place, and time.      UC Treatments / Results  Labs (all labs ordered are listed, but only abnormal results are displayed) Labs Reviewed - No data to display  EKG   Radiology No results found.  Procedures Procedures (including critical care time)  Medications Ordered in UC Medications - No data to display  Initial Impression / Assessment and Plan / UC Course  I have reviewed the triage vital signs and the nursing notes.  Pertinent labs & imaging results that were available during my care of the patient were reviewed by me and considered in my medical decision making (see chart for details).     Afebrile, nontoxic in office today. Ear pain likely second to eustachian tube dysfunction second to sinus swelling from acute on chronic sinusitis: Likely allergic in origin. Will continue allergy medication, nasal spray, start prednisone. Provided contact information for ENT given recurrence and temporality of exacerbations. Will defer Covid testing at this time given duration of symptoms.  Return precautions discussed, patient verbalized understanding and is agreeable to plan. Final Clinical Impressions(s) / UC Diagnoses   Final diagnoses:  Sinus  pressure  Dysfunction of right eustachian tube     Discharge Instructions     Take 2 tabs of prednisone once daily with breakfast. Important to continue nasal sprays. Recommend you follow-up with ENT for further evaluation/management if needed.    ED Prescriptions    Medication Sig Dispense Auth. Provider   predniSONE (DELTASONE) 20 MG tablet Take 2  tablets (40 mg total) by mouth daily with breakfast for 5 days. 10 tablet Hall-Potvin, [place], PA-C     PDMP not reviewed this encounter.   Hall-Potvin, [place], [place] 04/12/20 1518

## 2020-04-12 NOTE — Discharge Instructions (Signed)
Take 2 tabs of prednisone once daily with breakfast. Important to continue nasal sprays. Recommend you follow-up with ENT for further evaluation/management if needed.

## 2020-04-12 NOTE — ED Triage Notes (Signed)
C/O right facial pressure and congestion x approx 2 wks.  Has been Tyl Sinus, Azelastin nasal spray without relief.  Denies fevers.  Declines Covid test @ this time.

## 2020-08-19 ENCOUNTER — Ambulatory Visit
Admission: RE | Admit: 2020-08-19 | Discharge: 2020-08-19 | Disposition: A | Discharge status: Home / Self Care | Payer: Self-pay | Source: Ambulatory Visit | Attending: Emergency Medicine | Admitting: Emergency Medicine

## 2020-08-19 ENCOUNTER — Ambulatory Visit: Payer: Self-pay

## 2020-08-19 ENCOUNTER — Other Ambulatory Visit: Payer: Self-pay

## 2020-08-19 VITALS — BP 129/88 | HR 79 | Temp 98.0°F | Resp 20

## 2020-08-19 DIAGNOSIS — H6983 Other specified disorders of Eustachian tube, bilateral: Secondary | ICD-10-CM

## 2020-08-19 MED ORDER — METHYLPREDNISOLONE SODIUM SUCC 125 MG IJ SOLR
125.0000 mg | Freq: Once | INTRAMUSCULAR | Status: AC
  Administered 2020-08-19: 125 mg via INTRAMUSCULAR

## 2020-08-19 MED ORDER — FLUTICASONE PROPIONATE 50 MCG/ACT NA SUSP: 1.0000 | Freq: Every day | NASAL | 0 refills | Status: DC

## 2020-08-19 NOTE — ED Triage Notes (Signed)
Pt states she has had right ear pain for approximately 10 days. She had it previously and was given prednisone and it cleared, this was approximately 2 months ago. Pt is aox4 and ambulatory.

## 2020-08-19 NOTE — ED Provider Notes (Signed)
[name]    CSN: [id number] Arrival date & time: 08/19/20  1655      History   Chief Complaint Chief Complaint  Patient presents with  . Otalgia    x 1 and a half weeks    HPI Margaret Benjamin is a 34 y.o. female  Presenting for bilateral ear discomfort for the last 10 days.  Patient previously seen for this in June: Given steroids which helped.  Does not tolerate Flonase well, has been using azelastine, allergy pills.  Followed up by ENT since then with unremarkable findings.  No change in care plan.  Requesting contact information for local ENT for second opinion.  Fever, discharge, bleeding, trauma or travel.  Past Medical History:  Diagnosis Date  . Allergy   . Migraines     Patient Active Problem List   Diagnosis Date Noted  . Otalgia of right ear 09/29/2014    Past Surgical History:  Procedure Laterality Date  . WISDOM TOOTH EXTRACTION      OB History   No obstetric history on file.      Home Medications    Prior to Admission medications   Medication Sig Start Date End Date Taking? Authorizing Provider  antipyrine-benzocaine Lyla Son) otic solution Place 3-4 drops into the right ear every 2 (two) hours as needed for ear pain. Patient not taking: Reported on 06/20/2018 09/26/14   Waldon Merl, PA-C  azelastine (ASTELIN) 0.1 % nasal spray Place 2 sprays into both nostrils 2 (two) times daily. Use in each nostril as directed 04/30/19   Cathie Hoops, Amy V, PA-C  fluticasone (FLONASE) 50 MCG/ACT nasal spray Place 1 spray into both nostrils daily. 08/19/20   Hall-Potvin, [place], PA-C  Multiple Vitamin (MULTIVITAMIN) tablet Take 1 tablet by mouth daily.    [provider]  pseudoephedrine (SUDAFED) 30 MG tablet Take 1 tablet (30 mg total) by mouth every 6 (six) hours as needed for congestion. 07/18/17   Janne Napoleon, NP    Family History Family History  Problem Relation Age of Onset  . Hypertension Mother   . Thyroid disease Mother   .  Healthy Father   . Diabetes Maternal Grandmother     Social History Social History   Tobacco Use  . Smoking status: Never Smoker  . Smokeless tobacco: Never Used  Vaping Use  . Vaping Use: Never used  Substance Use Topics  . Alcohol use: No  . Drug use: No     Allergies   Patient has no known allergies.   Review of Systems As per HPI   Physical Exam Triage Vital Signs ED Triage Vitals  Enc Vitals Group     BP      Pulse      Resp      Temp      Temp src      SpO2      Weight      Height      Head Circumference      Peak Flow      Pain Score      Pain Loc      Pain Edu?      Excl. in GC?    No data found.  Updated Vital Signs BP 129/88 (BP Location: Right Arm)   Pulse 79   Temp 98 F (36.7 C) (Oral)   Resp 20   LMP  (LMP Unknown)   SpO2 98%   Visual Acuity Right Eye Distance:   Left Eye  Distance:   Bilateral Distance:    Right Eye Near:   Left Eye Near:    Bilateral Near:     Physical Exam Constitutional:      General: She is not in acute distress. HENT:     Head: Normocephalic and atraumatic.     Right Ear: Ear canal and external ear normal.     Left Ear: Ear canal and external ear normal.     Ears:     Comments: Negative tragal tenderness bilaterally.  Left TM with mild retraction, intact.  Right TM with clear, serous fluid.  No bulging or retraction, intact Eyes:     General: No scleral icterus.    Pupils: Pupils are equal, round, and reactive to light.  Cardiovascular:     Rate and Rhythm: Normal rate.  Pulmonary:     Effort: Pulmonary effort is normal.  Skin:    Coloration: Skin is not jaundiced or pale.  Neurological:     Mental Status: She is alert and oriented to person, place, and time.      UC Treatments / Results  Labs (all labs ordered are listed, but only abnormal results are displayed) Labs Reviewed - No data to display  EKG   Radiology No results found.  Procedures Procedures (including critical care  time)  Medications Ordered in UC Medications  methylPREDNISolone sodium succinate (SOLU-MEDROL) 125 mg/2 mL injection 125 mg (125 mg Intramuscular Given 08/19/20 1727)    Initial Impression / Assessment and Plan / UC Course  I have reviewed the triage vital signs and the nursing notes.  Pertinent labs & imaging results that were available during my care of the patient were reviewed by me and considered in my medical decision making (see chart for details).     Eustachian tube dysfunction.  Provided local ENT contact information for follow-up as needed.  Solu-Medrol given in office in lieu of p.o. regimen.  Tolerated well.  Return precautions discussed, pt verbalized understanding and is agreeable to plan. Final Clinical Impressions(s) / UC Diagnoses   Final diagnoses:  Eustachian tube dysfunction, bilateral   Discharge Instructions   None    ED Prescriptions    Medication Sig Dispense Auth. Provider   fluticasone (FLONASE) 50 MCG/ACT nasal spray Place 1 spray into both nostrils daily. 16 g Hall-Potvin, [place], PA-C     PDMP not reviewed this encounter.   Hall-Potvin, [place], [place] 08/19/20 1731

## 2023-04-21 ENCOUNTER — Ambulatory Visit: Payer: Self-pay

## 2023-04-24 ENCOUNTER — Ambulatory Visit
Admission: RE | Admit: 2023-04-24 | Discharge: 2023-04-24 | Disposition: A | Discharge status: Home / Self Care | Payer: Medicaid Other | Source: Ambulatory Visit | Attending: Nurse Practitioner | Admitting: Nurse Practitioner

## 2023-04-24 VITALS — BP 134/92 | HR 90 | Temp 98.2°F | Resp 16

## 2023-04-24 DIAGNOSIS — L02213 Cutaneous abscess of chest wall: Secondary | ICD-10-CM | POA: Insufficient documentation

## 2023-04-24 HISTORY — DX: Cutaneous abscess, unspecified: L02.91

## 2023-04-24 MED ORDER — CLINDAMYCIN HCL 300 MG PO CAPS: 300.0000 mg | ORAL_CAPSULE | Freq: Three times a day (TID) | ORAL | 0 refills | Status: AC

## 2023-04-24 MED ORDER — IBUPROFEN 600 MG PO TABS: 600.0000 mg | ORAL_TABLET | Freq: Four times a day (QID) | ORAL | 0 refills | Status: DC | PRN

## 2023-04-24 NOTE — ED Provider Notes (Signed)
[name]    CSN: [id number] Arrival date & time: 04/24/23  1238      History   Chief Complaint Chief Complaint  Patient presents with   Abscess    HPI Margaret Benjamin is a 37 y.o. female.   Subjective:   Margaret Benjamin is a 37 y.o. female who presents for evaluation of a probable cutaneous abscess. Lesion is located to the chest wall. Onset was 4 days ago. Symptoms have been well-controlled. Abscess has associated symptoms of pain. She denies any spontaneous drainage, fever, chills, anorexia, nausea or vomiting. Patient does have previous history of cutaneous abscesses to the same area back in July 2018 and underwent I&D at that time. She has not have any further abscess since until now. Patient does not have diabetes.  The following portions of the patient's history were reviewed and updated as appropriate: allergies, current medications, past family history, past medical history, past social history, past surgical history, and problem list.        Past Medical History:  Diagnosis Date   Abscess    Allergy    Migraines     Patient Active Problem List   Diagnosis Date Noted   Otalgia of right ear 09/29/2014    Past Surgical History:  Procedure Laterality Date   WISDOM TOOTH EXTRACTION      OB History   No obstetric history on file.      Home Medications    Prior to Admission medications   Medication Sig Start Date End Date Taking? Authorizing Provider  clindamycin (CLEOCIN) 300 MG capsule Take 1 capsule (300 mg total) by mouth 3 (three) times daily for 7 days. 04/24/23 05/01/23 Yes Lurline Idol, FNP  ibuprofen (ADVIL) 600 MG tablet Take 1 tablet (600 mg total) by mouth every 6 (six) hours as needed for mild pain or moderate pain. 04/24/23  Yes Lurline Idol, FNP    Family History Family History  Problem Relation Age of Onset   Hypertension Mother    Thyroid disease Mother    Healthy Father    Diabetes Maternal Grandmother      Social History Social History   Tobacco Use   Smoking status: Never   Smokeless tobacco: Never  Vaping Use   Vaping Use: Never used  Substance Use Topics   Alcohol use: Yes    Comment: rarely   Drug use: No     Allergies   Patient has no known allergies.   Review of Systems Review of Systems  Constitutional:  Negative for fatigue and fever.  Gastrointestinal:  Negative for nausea and vomiting.  Skin:  Positive for wound.  All other systems reviewed and are negative.    Physical Exam Triage Vital Signs ED Triage Vitals [04/24/23 1253]  Enc Vitals Group     BP (!) 134/92     Pulse Rate 90     Resp 16     Temp 98.2 F (36.8 C)     Temp Source Oral     SpO2 97 %     Weight      Height      Head Circumference      Peak Flow      Pain Score 4     Pain Loc      Pain Edu?      Excl. in GC?    No data found.  Updated Vital Signs BP (!) 134/92   Pulse 90   Temp 98.2 F (36.8 C) (Oral)  Resp 16   LMP 04/12/2023 (Exact Date)   SpO2 97%   Visual Acuity Right Eye Distance:   Left Eye Distance:   Bilateral Distance:    Right Eye Near:   Left Eye Near:    Bilateral Near:     Physical Exam Vitals reviewed.  Constitutional:      General: She is not in acute distress.    Appearance: Normal appearance. She is not ill-appearing, toxic-appearing or diaphoretic.  HENT:     Head: Normocephalic.  Cardiovascular:     Rate and Rhythm: Normal rate.  Pulmonary:     Effort: Pulmonary effort is normal.  Musculoskeletal:        General: Normal range of motion.     Cervical back: Normal range of motion and neck supple.  Skin:    General: Skin is warm and dry.     Findings: Abscess present.          Comments: Abscess to the anterior chest wall. See picture below.   Neurological:     General: No focal deficit present.     Mental Status: She is alert and oriented to person, place, and time.      UC Treatments / Results  Labs (all labs ordered are  listed, but only abnormal results are displayed) Labs Reviewed - No data to display  EKG   Radiology No results found.  Procedures Incision and Drainage  Date/Time: 04/24/2023 1:27 PM  Performed by: Lurline Idol, FNP Authorized by: Lurline Idol, FNP   Consent:    Consent obtained:  Verbal   Consent given by:  Patient   Risks, benefits, and alternatives were discussed: yes     Risks discussed:  Bleeding, incomplete drainage, pain and infection Universal protocol:    Patient identity confirmed:  Verbally with patient and arm band Location:    Type:  Abscess   Size:  1 cm   Location:  Trunk   Trunk location:  Chest Pre-procedure details:    Skin preparation:  Povidone-iodine Sedation:    Sedation type:  None Anesthesia:    Anesthesia method:  Local infiltration   Local anesthetic:  Lidocaine 1% w/o epi Procedure type:    Complexity:  Simple Procedure details:    Incision types:  Single straight   Incision depth:  Subcutaneous   Wound management:  Probed and deloculated   Drainage:  Purulent   Drainage amount:  Scant   Packing materials:  1/4 in iodoform gauze Post-procedure details:    Procedure completion:  Tolerated well, no immediate complications  (including critical care time)  Medications Ordered in UC Medications - No data to display  Initial Impression / Assessment and Plan / UC Course  I have reviewed the triage vital signs and the nursing notes.  Pertinent labs & imaging results that were available during my care of the patient were reviewed by me and considered in my medical decision making (see chart for details).    37 yo female presenting cutaneous abscess to anterior chest wall s/p I&D as above.  She is afebrile.  Nontoxic.  Postprocedure wound care and monitoring discussed.  Patient was on Bactrim in the past and did not tolerate well.  Will prescribe 7-day course of clindamycin as well as ibuprofen as needed for pain.  She should return  to the clinic in 2 days for packing removal and wound reevaluation.  Today's evaluation has revealed no signs of a dangerous process. Discussed diagnosis with patient and/or guardian. Patient and/or  guardian aware of their diagnosis, possible red flag symptoms to watch out for and need for close follow up. Patient and/or guardian understands verbal and written discharge instructions. Patient and/or guardian comfortable with plan and disposition.  Patient and/or guardian has a clear mental status at this time, good insight into illness (after discussion and teaching) and has clear judgment to make decisions regarding their care  Documentation was completed with the aid of voice recognition software. Transcription may contain typographical errors. Final Clinical Impressions(s) / UC Diagnoses   Final diagnoses:  Cutaneous abscess of chest wall     Discharge Instructions      A skin abscess is an infected area on or under your skin that contains a collection of pus and other material. An abscess can occur in or on almost any part of your body.  Your abscess has been drained today. A sample will be sent for cultures which will identify any bacterial growth.   Take antibiotics as prescribed  Apply moist heat or heat pack applied to the area several times a day. Check your abscess every day for signs of a worsening infection   Go to the ED immediately if you:  Have severe pain. See red streaks on your skin spreading away from the abscess. See redness that spreads quickly. Have a fever or chills.     ED Prescriptions     Medication Sig Dispense Auth. Provider   clindamycin (CLEOCIN) 300 MG capsule Take 1 capsule (300 mg total) by mouth 3 (three) times daily for 7 days. 21 capsule Lurline Idol, FNP   ibuprofen (ADVIL) 600 MG tablet Take 1 tablet (600 mg total) by mouth every 6 (six) hours as needed for mild pain or moderate pain. 30 tablet Lurline Idol, FNP      PDMP not  reviewed this encounter.   Lurline Idol, [place] 04/24/23 1353

## 2023-04-24 NOTE — ED Triage Notes (Signed)
Pt reports abscess below right breast onset approx 4 days ago. States abscess is getting larger; attempted to puncture abscess a few nights ago with a lancet at home. Denies fevers.

## 2023-04-24 NOTE — Discharge Instructions (Addendum)
A skin abscess is an infected area on or under your skin that contains a collection of pus and other material. An abscess can occur in or on almost any part of your body.  Your abscess has been drained today. A sample will be sent for cultures which will identify any bacterial growth.   Take antibiotics as prescribed  Apply moist heat or heat pack applied to the area several times a day. Check your abscess every day for signs of a worsening infection   Go to the ED immediately if you:  Have severe pain. See red streaks on your skin spreading away from the abscess. See redness that spreads quickly. Have a fever or chills.

## 2023-04-25 LAB — AEROBIC CULTURE W GRAM STAIN (SUPERFICIAL SPECIMEN)

## 2023-04-26 ENCOUNTER — Ambulatory Visit
Admission: RE | Admit: 2023-04-26 | Discharge: 2023-04-26 | Disposition: A | Discharge status: Home / Self Care | Payer: Medicaid Other | Source: Ambulatory Visit

## 2023-04-26 VITALS — BP 121/88 | HR 89 | Temp 98.2°F | Resp 16

## 2023-04-26 DIAGNOSIS — Z09 Encounter for follow-up examination after completed treatment for conditions other than malignant neoplasm: Secondary | ICD-10-CM | POA: Diagnosis not present

## 2023-04-26 NOTE — Discharge Instructions (Signed)
Keep wound covered with a clean, dry bandage. Use antibiotic ointment on the wound when you change the dressing. I recommend changing it at least once a day, more often if you are having a lot of drainage. Ok to shower with bandage on; just replace the bandage after you shower.

## 2023-04-26 NOTE — ED Triage Notes (Signed)
Patient here for packing removal from abscess. Denies any other complaints.

## 2023-04-26 NOTE — ED Provider Notes (Signed)
[name]    CSN: [id number] Arrival date & time: 04/26/23  1312      History   Chief Complaint Chief Complaint  Patient presents with   Follow-up    HPI Margaret Benjamin is a 37 y.o. female.  Patient abscess was I&D on 04/24/2023.  Here for packing removal.  Patient has been taking antibiotics and keeping wound covered with a bandage.  No complaints    HPI  Past Medical History:  Diagnosis Date   Abscess    Allergy    Migraines     Patient Active Problem List   Diagnosis Date Noted   Otalgia of right ear 09/29/2014    Past Surgical History:  Procedure Laterality Date   WISDOM TOOTH EXTRACTION      OB History   No obstetric history on file.      Home Medications    Prior to Admission medications   Medication Sig Start Date End Date Taking? Authorizing Provider  clindamycin (CLEOCIN) 300 MG capsule Take 1 capsule (300 mg total) by mouth 3 (three) times daily for 7 days. 04/24/23 05/01/23  Lurline Idol, FNP  ibuprofen (ADVIL) 600 MG tablet Take 1 tablet (600 mg total) by mouth every 6 (six) hours as needed for mild pain or moderate pain. 04/24/23   Lurline Idol, FNP    Family History Family History  Problem Relation Age of Onset   Hypertension Mother    Thyroid disease Mother    Healthy Father    Diabetes Maternal Grandmother     Social History Social History   Tobacco Use   Smoking status: Never   Smokeless tobacco: Never  Vaping Use   Vaping Use: Never used  Substance Use Topics   Alcohol use: Yes    Comment: rarely   Drug use: No     Allergies   Patient has no known allergies.   Review of Systems Review of Systems   Physical Exam Triage Vital Signs ED Triage Vitals  Enc Vitals Group     BP 04/26/23 1326 121/88     Pulse Rate 04/26/23 1326 89     Resp 04/26/23 1326 16     Temp 04/26/23 1326 98.2 F (36.8 C)     Temp Source 04/26/23 1326 Oral     SpO2 04/26/23 1326 98 %     Weight --      Height --       Head Circumference --      Peak Flow --      Pain Score 04/26/23 1327 4     Pain Loc --      Pain Edu? --      Excl. in GC? --    No data found.  Updated Vital Signs BP 121/88 (BP Location: Left Arm)   Pulse 89   Temp 98.2 F (36.8 C) (Oral)   Resp 16   LMP 04/12/2023 (Exact Date)   SpO2 98%   Visual Acuity Right Eye Distance:   Left Eye Distance:   Bilateral Distance:    Right Eye Near:   Left Eye Near:    Bilateral Near:     Physical Exam Constitutional:      Appearance: Normal appearance.  Pulmonary:     Effort: Pulmonary effort is normal.  Skin:    Findings: Abscess present.       Neurological:     Mental Status: She is alert.      UC Treatments / Results  Labs (all  labs ordered are listed, but only abnormal results are displayed) Labs Reviewed - No data to display  EKG   Radiology No results found.  Procedures Procedures (including critical care time)  Medications Ordered in UC Medications - No data to display  Initial Impression / Assessment and Plan / UC Course  I have reviewed the triage vital signs and the nursing notes.  Pertinent labs & imaging results that were available during my care of the patient were reviewed by me and considered in my medical decision making (see chart for details).    Discussed normal healing process and care for skin with wound from I&D of abscess  Final Clinical Impressions(s) / UC Diagnoses   Final diagnoses:  Encounter for recheck of abscess following incision and drainage     Discharge Instructions      Keep wound covered with a clean, dry bandage. Use antibiotic ointment on the wound when you change the dressing. I recommend changing it at least once a day, more often if you are having a lot of drainage. Ok to shower with bandage on; just replace the bandage after you shower.      ED Prescriptions   None    PDMP not reviewed this encounter.   Cathlyn Parsons, NP 04/26/23 1340

## 2023-04-27 LAB — AEROBIC CULTURE W GRAM STAIN (SUPERFICIAL SPECIMEN)

## 2023-05-02 ENCOUNTER — Telehealth: Payer: Self-pay | Admitting: Emergency Medicine

## 2023-05-02 NOTE — Telephone Encounter (Signed)
Pt called and sts she was unable to complete the antibiotics due to her work schedule and they were making her feel sick; pt told to just stop meds as course was already supposed to have been completed and could return if needed

## 2023-05-03 ENCOUNTER — Ambulatory Visit: Payer: Medicaid Other

## 2023-05-06 ENCOUNTER — Ambulatory Visit: Payer: Self-pay

## 2023-05-10 ENCOUNTER — Ambulatory Visit
Admission: RE | Admit: 2023-05-10 | Discharge: 2023-05-10 | Disposition: A | Discharge status: Home / Self Care | Payer: Medicaid Other | Source: Ambulatory Visit

## 2023-05-10 VITALS — BP 127/86 | HR 81 | Temp 98.4°F | Resp 16 | Ht 65.0 in | Wt 112.0 lb

## 2023-05-10 DIAGNOSIS — L02213 Cutaneous abscess of chest wall: Secondary | ICD-10-CM | POA: Diagnosis not present

## 2023-05-10 DIAGNOSIS — H6591 Unspecified nonsuppurative otitis media, right ear: Secondary | ICD-10-CM | POA: Diagnosis not present

## 2023-05-10 MED ORDER — DOXYCYCLINE HYCLATE 100 MG PO CAPS: 100.0000 mg | ORAL_CAPSULE | Freq: Two times a day (BID) | ORAL | 0 refills | Status: AC

## 2023-05-10 MED ORDER — FLUTICASONE PROPIONATE 50 MCG/ACT NA SUSP: 1.0000 | Freq: Every day | NASAL | 0 refills | Status: DC

## 2023-05-10 MED ORDER — CETIRIZINE HCL 10 MG PO TABS: 10.0000 mg | ORAL_TABLET | Freq: Every day | ORAL | 0 refills | Status: AC

## 2023-05-10 NOTE — ED Triage Notes (Signed)
  Called regarding clindamycin making me sick was told to come back in after nurse told me stop taking it, follow up - Entered by patient

## 2023-05-10 NOTE — ED Triage Notes (Signed)
"  Skin abscess drained about 2 wks ago". Within first 48 hrs "Clindamycin was making me sick". Called 2x after to have medication changed "was told to stop them but no medication changed to at the time & infection isn't gone". No fever.   Also, severe right ear pressure and pain at times.

## 2023-05-10 NOTE — ED Provider Notes (Signed)
[name]    CSN: [id number] Arrival date & time: 05/10/23  1202      History   Chief Complaint Chief Complaint  Patient presents with   Follow-up    Called regarding clindamycin making me sick was told to come back in after nurse told me stop taking it, follow up - Entered by patient    HPI Margaret Benjamin is a 37 y.o. female.   Patient presents with 2 different chief complaints.  Patient reports that she has an abscess to her chest wall that has been present for about 2 weeks.  She originally seen in urgent care and had it drained.  She reports that she was taking clindamycin but not tolerating well so she did not complete the medication.  She states that she notified urgent care twice that she was not tolerating it but they told her to stop taking it and follow-up here.  She reports that it has improved in appearance and she denies any purulent drainage.  Denies any fever.  Patient also reports some right ear pain and pressure for about a week.  Denies any associated upper respiratory symptoms.     Past Medical History:  Diagnosis Date   Abscess    Allergy    Migraines     Patient Active Problem List   Diagnosis Date Noted   Otalgia of right ear 09/29/2014    Past Surgical History:  Procedure Laterality Date   WISDOM TOOTH EXTRACTION      OB History   No obstetric history on file.      Home Medications    Prior to Admission medications   Medication Sig Start Date End Date Taking? Authorizing Provider  cetirizine (ZYRTEC) 10 MG tablet Take 1 tablet (10 mg total) by mouth daily. 05/10/23  Yes Velvet Moomaw, Acie Fredrickson, FNP  doxycycline (VIBRAMYCIN) 100 MG capsule Take 1 capsule (100 mg total) by mouth 2 (two) times daily for 7 days. 05/10/23 05/17/23 Yes Raylyn Speckman, Acie Fredrickson, FNP  fluticasone (FLONASE) 50 MCG/ACT nasal spray Place 1 spray into both nostrils daily. 05/10/23  Yes Kortnie Stovall, Rolly Salter E, FNP  STATUS COVID-19/FLU A&B KIT TEST AS DIRECTED TODAY 12/18/22  Yes [provider]  ibuprofen (ADVIL) 600 MG tablet Take 1 tablet (600 mg total) by mouth every 6 (six) hours as needed for mild pain or moderate pain. Patient taking differently: Take 600 mg by mouth every 6 (six) hours as needed for mild pain or moderate pain. Didn't use it. 04/24/23   Lurline Idol, FNP    Family History Family History  Problem Relation Age of Onset   Hypertension Mother    Thyroid disease Mother    Healthy Father    Diabetes Maternal Grandmother     Social History Social History   Tobacco Use   Smoking status: Never   Smokeless tobacco: Never  Vaping Use   Vaping Use: Never used  Substance Use Topics   Alcohol use: Yes    Comment: rarely   Drug use: No     Allergies   Patient has no known allergies.   Review of Systems Review of Systems Per HPI  Physical Exam Triage Vital Signs ED Triage Vitals  Enc Vitals Group     BP 05/10/23 1212 127/86     Pulse Rate 05/10/23 1212 81     Resp 05/10/23 1212 16     Temp 05/10/23 1212 98.4 F (36.9 C)     Temp Source 05/10/23 1212 Oral  SpO2 05/10/23 1212 96 %     Weight 05/10/23 1209 112 lb (50.8 kg)     Height 05/10/23 1209 5\' 5"  (1.651 m)     Head Circumference --      Peak Flow --      Pain Score 05/10/23 1208 3     Pain Loc --      Pain Edu? --      Excl. in GC? --    No data found.  Updated Vital Signs BP 127/86 (BP Location: Left Arm)   Pulse 81   Temp 98.4 F (36.9 C) (Oral)   Resp 16   Ht 5\' 5"  (1.651 m)   Wt 112 lb (50.8 kg)   LMP 04/29/2023 (Exact Date)   SpO2 96%   BMI 18.64 kg/m   Visual Acuity Right Eye Distance:   Left Eye Distance:   Bilateral Distance:    Right Eye Near:   Left Eye Near:    Bilateral Near:     Physical Exam Constitutional:      General: She is not in acute distress.    Appearance: Normal appearance. She is not toxic-appearing or diaphoretic.  HENT:     Head: Normocephalic and atraumatic.     Right Ear: Ear canal and external ear normal. No  swelling or tenderness. A middle ear effusion is present. There is no impacted cerumen. Tympanic membrane is not perforated, erythematous or bulging.  Eyes:     Extraocular Movements: Extraocular movements intact.     Conjunctiva/sclera: Conjunctivae normal.  Pulmonary:     Effort: Pulmonary effort is normal.  Skin:    Comments: Patient has approximately 1 cm indurated abscess present to lower center chest.  No breast involvement.  No drainage noted.  Neurological:     General: No focal deficit present.     Mental Status: She is alert and oriented to person, place, and time. Mental status is at baseline.  Psychiatric:        Mood and Affect: Mood normal.        Behavior: Behavior normal.        Thought Content: Thought content normal.        Judgment: Judgment normal.      UC Treatments / Results  Labs (all labs ordered are listed, but only abnormal results are displayed) Labs Reviewed - No data to display  EKG   Radiology No results found.  Procedures Procedures (including critical care time)  Medications Ordered in UC Medications - No data to display  Initial Impression / Assessment and Plan / UC Course  I have reviewed the triage vital signs and the nursing notes.  Pertinent labs & imaging results that were available during my care of the patient were reviewed by me and considered in my medical decision making (see chart for details).     1.  Ear pain  Patient has fluid behind TM.  Will treat with cetirizine antihistamine and Flonase as patient denies that she takes these daily so this should be safe.  Encouraged follow-up if ear pain persists.  2.  Small abscess  Abscess appears to be improved from previous photo noted in urgent care visit.  It does appear to be still indurated with surrounding swelling.  Patient not tolerating clindamycin well so will prescribe short course of doxycycline.  Advised her to take this with food.  Advised her to follow-up if any  symptoms persist or worsen.  Patient verbalized understanding and was agreeable with plan.  Final Clinical Impressions(s) / UC Diagnoses   Final diagnoses:  Abscess of chest wall  Fluid level behind tympanic membrane of right ear     Discharge Instructions      Stop clindamycin.  Start doxycycline.  Take with food to avoid stomach upset.  I have also prescribed you 2 other medications for your ear pain.  Follow-up if any symptoms persist or worsen.    ED Prescriptions     Medication Sig Dispense Auth. Provider   doxycycline (VIBRAMYCIN) 100 MG capsule Take 1 capsule (100 mg total) by mouth 2 (two) times daily for 7 days. 14 capsule [place], [place] E, [place]   fluticasone [facility]) 50 MCG/ACT nasal spray Place 1 spray into both nostrils daily. 16 g Ervin Knack E, [place]   cetirizine (ZYRTEC) 10 MG tablet Take 1 tablet (10 mg total) by mouth daily. 30 tablet [place], Acie Fredrickson, [place]      PDMP not reviewed this encounter.   Gustavus Bryant, [place] 05/10/23 [phone]

## 2023-05-10 NOTE — Discharge Instructions (Signed)
Stop clindamycin.  Start doxycycline.  Take with food to avoid stomach upset.  I have also prescribed you 2 other medications for your ear pain.  Follow-up if any symptoms persist or worsen.

## 2023-06-05 ENCOUNTER — Telehealth: Payer: Medicaid Other | Admitting: Physician Assistant

## 2023-06-05 ENCOUNTER — Inpatient Hospital Stay: Admission: RE | Admit: 2023-06-05 | Payer: Self-pay | Source: Ambulatory Visit

## 2023-06-05 DIAGNOSIS — H6501 Acute serous otitis media, right ear: Secondary | ICD-10-CM

## 2023-06-05 MED ORDER — AMOXICILLIN-POT CLAVULANATE 875-125 MG PO TABS: 1.0000 | ORAL_TABLET | Freq: Two times a day (BID) | ORAL | 0 refills | Status: DC

## 2023-06-05 MED ORDER — CIPROFLOXACIN-DEXAMETHASONE 0.3-0.1 % OT SUSP: 4.0000 [drp] | Freq: Two times a day (BID) | OTIC | 0 refills | Status: DC

## 2023-06-05 NOTE — Progress Notes (Signed)
Virtual Visit Consent   Margaret Benjamin, you are scheduled for a virtual visit with a [facility] provider today. Just as with appointments in the office, your consent must be obtained to participate. Your consent will be active for this visit and any virtual visit you may have with one of our providers in the next 365 days. If you have a MyChart account, a copy of this consent can be sent to you electronically.  As this is a virtual visit, video technology does not allow for your provider to perform a traditional examination. This may limit your provider's ability to fully assess your condition. If your provider identifies any concerns that need to be evaluated in person or the need to arrange testing (such as labs, EKG, etc.), we will make arrangements to do so. Although advances in technology are sophisticated, we cannot ensure that it will always work on either your end or our end. If the connection with a video visit is poor, the visit may have to be switched to a telephone visit. With either a video or telephone visit, we are not always able to ensure that we have a secure connection.  By engaging in this virtual visit, you consent to the provision of healthcare and authorize for your insurance to be billed (if applicable) for the services provided during this visit. Depending on your insurance coverage, you may receive a charge related to this service.  I need to obtain your verbal consent now. Are you willing to proceed with your visit today? Margaret Benjamin has provided verbal consent on 06/05/2023 for a virtual visit (video or telephone). Margaret Loveless, PA-C  Date: 06/05/2023 5:07 PM  Virtual Visit via Video Note   I, Margaret Benjamin, connected with  Margaret Benjamin  ([id number], 01/27/1986) on 06/05/23 at  5:00 PM EDT by a video-enabled telemedicine application and verified that I am speaking with the correct person using two identifiers.  Location: Patient: Virtual Visit Location  Patient: Home Provider: Virtual Visit Location Provider: Home Office   I discussed the limitations of evaluation and management by telemedicine and the availability of in person appointments. The patient expressed understanding and agreed to proceed.    History of Present Illness: Margaret Benjamin is a 37 y.o. who identifies as a female who was assigned female at birth, and is being seen today for right ear pain.  HPI: Otalgia  There is pain in the right ear. This is a new problem. The current episode started 1 to 4 weeks ago (3 weeks ago; went to [facility] and was started on Flonase and Zyrtec for fluid behind TM; worsened over last 2-3 days). The problem occurs constantly. The problem has been rapidly worsening. There has been no fever. The pain is at a severity of 9/10. The pain is severe. Associated symptoms include headaches and hearing loss (right). Pertinent negatives include no ear discharge, neck pain, rhinorrhea, sore throat or vomiting. Associated symptoms comments: Post nasal drainage . The treatment provided no relief. There is no history of a chronic ear infection, hearing loss or a tympanostomy tube.     Problems:  Patient Active Problem List   Diagnosis Date Noted   Otalgia of right ear 09/29/2014    Allergies:  Allergies  Allergen Reactions   Sulfamethoxazole-Trimethoprim Nausea And Vomiting   Medications:  Current Outpatient Medications:    amoxicillin-clavulanate (AUGMENTIN) 875-125 MG tablet, Take 1 tablet by mouth 2 (two) times daily., Disp: 20 tablet, Rfl: 0   ciprofloxacin-dexamethasone (CIPRODEX) OTIC  suspension, Place 4 drops into the right ear 2 (two) times daily. X 7 days, Disp: 7.5 mL, Rfl: 0   cetirizine (ZYRTEC) 10 MG tablet, Take 1 tablet (10 mg total) by mouth daily., Disp: 30 tablet, Rfl: 0   fluticasone (FLONASE) 50 MCG/ACT nasal spray, Place 1 spray into both nostrils daily., Disp: 16 g, Rfl: 0   ibuprofen (ADVIL) 600 MG tablet, Take 1 tablet (600 mg total) by  mouth every 6 (six) hours as needed for mild pain or moderate pain. (Patient taking differently: Take 600 mg by mouth every 6 (six) hours as needed for mild pain or moderate pain. Didn't use it.), Disp: 30 tablet, Rfl: 0   STATUS COVID-19/FLU A&B KIT, TEST AS DIRECTED TODAY, Disp: , Rfl:   Observations/Objective: Patient is well-developed, well-nourished in no acute distress.  Resting comfortably at home.  Head is normocephalic, atraumatic.  No labored breathing.  Speech is clear and coherent with logical content.  Patient is alert and oriented at baseline.    Assessment and Plan: 1. Non-recurrent acute serous otitis media of right ear - amoxicillin-clavulanate (AUGMENTIN) 875-125 MG tablet; Take 1 tablet by mouth 2 (two) times daily.  Dispense: 20 tablet; Refill: 0 - ciprofloxacin-dexamethasone (CIPRODEX) OTIC suspension; Place 4 drops into the right ear 2 (two) times daily. X 7 days  Dispense: 7.5 mL; Refill: 0  - Worsening symptoms that have not responded to OTC medications.  - Will give Augmentin and Ciprodex - Continue saline nasal rinses - Could consider to add Flonase (Fluticasone) nasal spray over the counter for possible eustachian tube dysfunction - Steam and humidifier can help - Warm compress to ear - Stay well hydrated and get plenty of rest.  - Seek in person evaluation if no symptom improvement or if symptoms worsen    Follow Up Instructions: I discussed the assessment and treatment plan with the patient. The patient was provided an opportunity to ask questions and all were answered. The patient agreed with the plan and demonstrated an understanding of the instructions.  A copy of instructions were sent to the patient via MyChart unless otherwise noted below.    The patient was advised to call back or seek an in-person evaluation if the symptoms worsen or if the condition fails to improve as anticipated.  Time:  I spent 10 minutes with the patient via telehealth  technology discussing the above problems/concerns.    Margaret Loveless, PA-C

## 2023-06-05 NOTE — Patient Instructions (Signed)
Margaret Benjamin, thank you for joining Margaret Loveless, PA-C for today's virtual visit.  While this provider is not your primary care provider (PCP), if your PCP is located in our provider database this encounter information will be shared with them immediately following your visit.   A [facility] MyChart account gives you access to today's visit and all your visits, tests, and labs performed at [facility] " click here if you don't have a [facility] MyChart account or go to [email or web address]  Consent: (Patient) Ovell Benjamin provided verbal consent for this virtual visit at the beginning of the encounter.  Current Medications:  Current Outpatient Medications:    amoxicillin-clavulanate (AUGMENTIN) 875-125 MG tablet, Take 1 tablet by mouth 2 (two) times daily., Disp: 20 tablet, Rfl: 0   ciprofloxacin-dexamethasone (CIPRODEX) OTIC suspension, Place 4 drops into the right ear 2 (two) times daily. X 7 days, Disp: 7.5 mL, Rfl: 0   cetirizine (ZYRTEC) 10 MG tablet, Take 1 tablet (10 mg total) by mouth daily., Disp: 30 tablet, Rfl: 0   fluticasone (FLONASE) 50 MCG/ACT nasal spray, Place 1 spray into both nostrils daily., Disp: 16 g, Rfl: 0   ibuprofen (ADVIL) 600 MG tablet, Take 1 tablet (600 mg total) by mouth every 6 (six) hours as needed for mild pain or moderate pain. (Patient taking differently: Take 600 mg by mouth every 6 (six) hours as needed for mild pain or moderate pain. Didn't use it.), Disp: 30 tablet, Rfl: 0   STATUS COVID-19/FLU A&B KIT, TEST AS DIRECTED TODAY, Disp: , Rfl:    Medications ordered in this encounter:  Meds ordered this encounter  Medications   amoxicillin-clavulanate (AUGMENTIN) 875-125 MG tablet    Sig: Take 1 tablet by mouth 2 (two) times daily.    Dispense:  20 tablet    Refill:  0    Order Specific Question:   Supervising Provider    Answer:   Merrilee Jansky [id number]   ciprofloxacin-dexamethasone (CIPRODEX) OTIC suspension    Sig:  Place 4 drops into the right ear 2 (two) times daily. X 7 days    Dispense:  7.5 mL    Refill:  0    Order Specific Question:   Supervising Provider    Answer:   Merrilee Jansky [[id number]]     *If you need refills on other medications prior to your next appointment, please contact your pharmacy*  Follow-Up: Call back or seek an in-person evaluation if the symptoms worsen or if the condition fails to improve as anticipated.   Virtual Care [phone]  Other Instructions Otitis Media, Adult  Otitis media occurs when there is inflammation and fluid in the middle ear with signs and symptoms of an acute infection. The middle ear is a part of the ear that contains bones for hearing as well as air that helps send sounds to the brain. When infected fluid builds up in this space, it causes pressure and can lead to an ear infection. The eustachian tube connects the middle ear to the back of the nose (nasopharynx) and normally allows air into the middle ear. If the eustachian tube becomes blocked, fluid can build up and become infected. What are the causes? This condition is caused by a blockage in the eustachian tube. This can be caused by mucus or by swelling of the tube. Problems that can cause a blockage include: A cold or other upper respiratory infection. Allergies. An irritant, such as tobacco smoke. Enlarged adenoids. The adenoids  are areas of soft tissue located high in the back of the throat, behind the nose and the roof of the mouth. They are part of the body's defense system (immune system). A mass in the nasopharynx. Damage to the ear caused by pressure changes (barotrauma). What increases the risk? You are more likely to develop this condition if you: Smoke or are exposed to tobacco smoke. Have an opening in the roof of your mouth (cleft palate). Have gastroesophageal reflux. Have an immune system disorder. What are the signs or symptoms? Symptoms of this condition  include: Ear pain. Fever. Decreased hearing. Tiredness (lethargy). Fluid leaking from the ear, if the eardrum is ruptured or has burst. Ringing in the ear. How is this diagnosed?  This condition is diagnosed with a physical exam. During the exam, your health care provider will use an instrument called an otoscope to look in your ear and check for redness, swelling, and fluid. He or she will also ask about your symptoms. Your health care provider may also order tests, such as: A pneumatic otoscopy. This is a test to check the movement of the eardrum. It is done by squeezing a small amount of air into the ear. A tympanogram. This is a test that shows how well the eardrum moves in response to air pressure in the ear canal. It provides a graph for your health care provider to review. How is this treated? This condition can go away on its own within 3-5 days. But if the condition is caused by a bacterial infection and does not go away on its own, or if it keeps coming back, your health care provider may: Prescribe antibiotic medicine to treat the infection. Prescribe or recommend medicines to control pain. Follow these instructions at home: Take over-the-counter and prescription medicines only as told by your health care provider. If you were prescribed an antibiotic medicine, take it as told by your health care provider. Do not stop taking the antibiotic even if you start to feel better. Keep all follow-up visits. This is important. Contact a health care provider if: You have bleeding from your nose. There is a lump on your neck. You are not feeling better in 5 days. You feel worse instead of better. Get help right away if: You have severe pain that is not controlled with medicine. You have swelling, redness, or pain around your ear. You have stiffness in your neck. A part of your face is not moving (paralyzed). The bone behind your ear (mastoid bone) is tender when you touch it. You  develop a severe headache. Summary Otitis media is redness, soreness, and swelling of the middle ear, usually resulting in pain and decreased hearing. This condition can go away on its own within 3-5 days. If the problem does not go away in 3-5 days, your health care provider may give you medicines to treat the infection. If you were prescribed an antibiotic medicine, take it as told by your health care provider. Follow all instructions that were given to you by your health care provider. This information is not intended to replace advice given to you by your health care provider. Make sure you discuss any questions you have with your health care provider. Document Revised: 02/01/2021 Document Reviewed: 02/01/2021 Elsevier Patient Education  2024 [employer]    If you have been instructed to have an in-person evaluation today at a local Urgent Care facility, please use the link below. It will take you to a list of all  of our available [facility]gent [facility]ares, including address, phone number and hours of operation. Please do not delay care.  [facility]  If you or a family member do not have a primary care provider, use the link below to schedule a visit and establish care. When you choose a [facility] primary care physician or advanced practice provider, you gain a long-term partner in health. Find a Primary Care Provider  Learn more about [facility]'s in-office and virtual care options: [facility] - Get Care Now

## 2023-07-21 ENCOUNTER — Ambulatory Visit
Admission: RE | Admit: 2023-07-21 | Discharge: 2023-07-21 | Disposition: A | Discharge status: Home / Self Care | Payer: Medicaid Other | Source: Ambulatory Visit | Attending: Physician Assistant | Admitting: Physician Assistant

## 2023-07-21 VITALS — BP 122/82 | HR 80 | Temp 98.4°F | Resp 18 | Ht 65.0 in | Wt 113.0 lb

## 2023-07-21 DIAGNOSIS — L309 Dermatitis, unspecified: Secondary | ICD-10-CM

## 2023-07-21 MED ORDER — DEXAMETHASONE SODIUM PHOSPHATE 10 MG/ML IJ SOLN
10.0000 mg | Freq: Once | INTRAMUSCULAR | Status: AC
  Administered 2023-07-21: 10 mg via INTRAMUSCULAR

## 2023-07-21 NOTE — ED Provider Notes (Signed)
[name]    CSN: [id number] Arrival date & time: 07/21/23  1447      History   Chief Complaint Chief Complaint  Patient presents with   Allergic Reaction    Appt    HPI Margaret Benjamin is a 37 y.o. female.   Patient here today for evaluation of itchy rash on abdomen and back that started around 3 AM this morning.  She reports that she took Zyrtec at 5 AM without significant improvement.  She denies any shortness of breath or trouble swallowing.  She does report that she did change her bleach recently and is not sure if the off brand bleach has caused this issue but it would make sense.  She reports that rash has improved but she continues to have itching in the same areas where rash was present.  At 1 point rash did move down her legs as well.  The history is provided by the patient.  Allergic Reaction Presenting symptoms: rash   Presenting symptoms: no difficulty swallowing     Past Medical History:  Diagnosis Date   Abscess    Allergy    Migraines     Patient Active Problem List   Diagnosis Date Noted   Otalgia of right ear 09/29/2014    Past Surgical History:  Procedure Laterality Date   WISDOM TOOTH EXTRACTION      OB History   No obstetric history on file.      Home Medications    Prior to Admission medications   Medication Sig Start Date End Date Taking? Authorizing Provider  cetirizine (ZYRTEC) 10 MG tablet Take 1 tablet (10 mg total) by mouth daily. Patient taking differently: Take 10 mg by mouth daily. Last dose: 5am. 05/10/23  [place], Acie Fredrickson, FNP  amoxicillin-clavulanate (AUGMENTIN) 875-125 MG tablet Take 1 tablet by mouth 2 (two) times daily. 06/05/23   [name], PA-C  ciprofloxacin-dexamethasone (CIPRODEX) OTIC suspension Place 4 drops into the right ear 2 (two) times daily. X 7 days 06/05/23   [name], PA-C  fluticasone [facility]) 50 MCG/ACT nasal spray Place 1 spray into both nostrils daily. 05/10/23   Gustavus Bryant, FNP  ibuprofen (ADVIL) 600 MG tablet Take 1 tablet (600 mg total) by mouth every 6 (six) hours as needed for mild pain or moderate pain. Patient taking differently: Take 600 mg by mouth every 6 (six) hours as needed for mild pain or moderate pain. Didn't use it. 04/24/23   Lurline Idol, FNP  STATUS COVID-19/FLU A&B KIT TEST AS DIRECTED TODAY 12/18/22   [provider]    Family History Family History  Problem Relation Age of Onset   Hypertension Mother    Thyroid disease Mother    Healthy Father    Diabetes Maternal Grandmother     Social History Social History   Tobacco Use   Smoking status: Never   Smokeless tobacco: Never  Vaping Use   Vaping status: Never Used  Substance Use Topics   Alcohol use: Yes    Comment: rarely   Drug use: No     Allergies   Sulfamethoxazole-trimethoprim   Review of Systems Review of Systems  Constitutional:  Negative for chills and fever.  HENT:  Negative for facial swelling and trouble swallowing.   Eyes:  Negative for discharge and redness.  Respiratory:  Negative for shortness of breath.   Gastrointestinal:  Negative for abdominal pain, nausea and vomiting.  Skin:  Positive for rash.  Physical Exam Triage Vital Signs ED Triage Vitals  Encounter Vitals Group     BP 07/21/23 1505 122/82     Systolic BP Percentile --      Diastolic BP Percentile --      Pulse Rate 07/21/23 1505 80     Resp 07/21/23 1505 18     Temp 07/21/23 1505 98.4 F (36.9 C)     Temp Source 07/21/23 1505 Oral     SpO2 07/21/23 1505 97 %     Weight 07/21/23 1503 113 lb (51.3 kg)     Height 07/21/23 1503 5\' 5"  (1.651 m)     Head Circumference --      Peak Flow --      Pain Score 07/21/23 1459 0     Pain Loc --      Pain Education --      Exclude from Growth Chart --    No data found.  Updated Vital Signs BP 122/82 (BP Location: Left Arm)   Pulse 80   Temp 98.4 F (36.9 C) (Oral)   Resp 18   Ht 5\' 5"  (1.651 m)   Wt 113  lb (51.3 kg)   LMP 07/10/2023 (Exact Date)   SpO2 97%   BMI 18.80 kg/m   Visual Acuity Right Eye Distance:   Left Eye Distance:   Bilateral Distance:    Right Eye Near:   Left Eye Near:    Bilateral Near:     Physical Exam Vitals and nursing note reviewed.  Constitutional:      General: She is not in acute distress.    Appearance: Normal appearance. She is not ill-appearing.  HENT:     Head: Normocephalic and atraumatic.  Eyes:     Conjunctiva/sclera: Conjunctivae normal.  Cardiovascular:     Rate and Rhythm: Normal rate.  Pulmonary:     Effort: Pulmonary effort is normal.  Skin:    Comments: Pictures on phone noted to have mildly erythematous raised wheals to abdomen and back  Neurological:     Mental Status: She is alert.  Psychiatric:        Mood and Affect: Mood normal.        Behavior: Behavior normal.        Thought Content: Thought content normal.      UC Treatments / Results  Labs (all labs ordered are listed, but only abnormal results are displayed) Labs Reviewed - No data to display  EKG   Radiology No results found.  Procedures Procedures (including critical care time)  Medications Ordered in UC Medications  dexamethasone (DECADRON) injection 10 mg (has no administration in time range)    Initial Impression / Assessment and Plan / UC Course  I have reviewed the triage vital signs and the nursing notes.  Pertinent labs & imaging results that were available during my care of the patient were reviewed by me and considered in my medical decision making (see chart for details).    Dexamethasone injection administered in office for treatment of dermatitis.  Recommended further evaluation if no gradual improvement or with any further concerns.  Final Clinical Impressions(s) / UC Diagnoses   Final diagnoses:  Dermatitis   Discharge Instructions   None    ED Prescriptions   None    PDMP not reviewed this encounter.   Tomi Bamberger, PA-C 07/21/23 1559

## 2023-07-21 NOTE — ED Triage Notes (Signed)
Small Welts on stomach and back not sure if food reaction or chemical from detergent the welts itch - Entered by patient.   First noticed "3am" today. "They are going down but flaring back up". Very itchy on upper body. "I did recently change to [facility] Value Bleach from [employer]" (that is new).

## 2023-09-09 ENCOUNTER — Telehealth: Payer: Medicaid Other | Admitting: Family

## 2023-09-09 DIAGNOSIS — R112 Nausea with vomiting, unspecified: Secondary | ICD-10-CM | POA: Diagnosis not present

## 2023-09-09 DIAGNOSIS — H6993 Unspecified Eustachian tube disorder, bilateral: Secondary | ICD-10-CM | POA: Diagnosis not present

## 2023-09-09 DIAGNOSIS — J069 Acute upper respiratory infection, unspecified: Secondary | ICD-10-CM

## 2023-09-09 MED ORDER — PREDNISONE 20 MG PO TABS: 40.0000 mg | ORAL_TABLET | Freq: Every day | ORAL | 0 refills | Status: AC

## 2023-09-09 MED ORDER — ONDANSETRON HCL 4 MG PO TABS: 4.0000 mg | ORAL_TABLET | Freq: Three times a day (TID) | ORAL | 0 refills | Status: AC | PRN

## 2023-09-09 NOTE — Patient Instructions (Signed)

## 2023-09-09 NOTE — Progress Notes (Signed)
Virtual Visit Consent   Margaret Benjamin, you are scheduled for a virtual visit with a [facility] provider today. Just as with appointments in the office, your consent must be obtained to participate. Your consent will be active for this visit and any virtual visit you may have with one of our providers in the next 365 days. If you have a MyChart account, a copy of this consent can be sent to you electronically.  As this is a virtual visit, video technology does not allow for your provider to perform a traditional examination. This may limit your provider's ability to fully assess your condition. If your provider identifies any concerns that need to be evaluated in person or the need to arrange testing (such as labs, EKG, etc.), we will make arrangements to do so. Although advances in technology are sophisticated, we cannot ensure that it will always work on either your end or our end. If the connection with a video visit is poor, the visit may have to be switched to a telephone visit. With either a video or telephone visit, we are not always able to ensure that we have a secure connection.  By engaging in this virtual visit, you consent to the provision of healthcare and authorize for your insurance to be billed (if applicable) for the services provided during this visit. Depending on your insurance coverage, you may receive a charge related to this service.  I need to obtain your verbal consent now. Are you willing to proceed with your visit today? Margaret Benjamin has provided verbal consent on 09/09/2023 for a virtual visit (video or telephone). Jannifer Rodney, FNP  Date: 09/09/2023 5:28 PM  Virtual Visit via Video Note   I, Jannifer Rodney, connected with  Margaret Benjamin  ([id number], 1986/07/01) on 09/09/23 at  5:30 PM EDT by a video-enabled telemedicine application and verified that I am speaking with the correct person using two identifiers.  Location: Patient: Virtual Visit Location Patient:  Home Provider: Virtual Visit Location Provider: Home Office   I discussed the limitations of evaluation and management by telemedicine and the availability of in person appointments. The patient expressed understanding and agreed to proceed.    History of Present Illness: Margaret Benjamin is a 37 y.o. who identifies as a female who was assigned female at birth, and is being seen today for URI symptoms that started last week.Reports Tuesday she was feeling worse, but started feeling better Wednesday, Thursday. However, yesterday started having diarrhea, N&V.   HPI: URI  This is a new problem. The current episode started 1 to 4 weeks ago. The problem has been waxing and waning. There has been no fever. Associated symptoms include congestion, coughing, diarrhea, ear pain, headaches (improved), nausea, rhinorrhea, sinus pain, sneezing, a sore throat and vomiting. She has tried eating, increased fluids and acetaminophen for the symptoms. The treatment provided mild relief.    Problems:  Patient Active Problem List   Diagnosis Date Noted   Otalgia of right ear 09/29/2014    Allergies:  Allergies  Allergen Reactions   Sulfamethoxazole-Trimethoprim Nausea And Vomiting   Medications:  Current Outpatient Medications:    ondansetron (ZOFRAN) 4 MG tablet, Take 1 tablet (4 mg total) by mouth every 8 (eight) hours as needed for nausea or vomiting., Disp: 20 tablet, Rfl: 0   predniSONE (DELTASONE) 20 MG tablet, Take 2 tablets (40 mg total) by mouth daily with breakfast for 5 days., Disp: 10 tablet, Rfl: 0   cetirizine (ZYRTEC) 10 MG tablet, Take  1 tablet (10 mg total) by mouth daily. (Patient taking differently: Take 10 mg by mouth daily. Last dose: 5am.), Disp: 30 tablet, Rfl: 0   fluticasone (FLONASE) 50 MCG/ACT nasal spray, Place 1 spray into both nostrils daily., Disp: 16 g, Rfl: 0   ibuprofen (ADVIL) 600 MG tablet, Take 1 tablet (600 mg total) by mouth every 6 (six) hours as needed for mild pain or  moderate pain. (Patient taking differently: Take 600 mg by mouth every 6 (six) hours as needed for mild pain or moderate pain. Didn't use it.), Disp: 30 tablet, Rfl: 0  Observations/Objective: Patient is well-developed, well-nourished in no acute distress.  Resting comfortably  at home.  Head is normocephalic, atraumatic.  No labored breathing.  Speech is clear and coherent with logical content.  Patient is alert and oriented at baseline.  Nasal congestion  Assessment and Plan: 1. Eustachian tube disorder, bilateral - predniSONE (DELTASONE) 20 MG tablet; Take 2 tablets (40 mg total) by mouth daily with breakfast for 5 days.  Dispense: 10 tablet; Refill: 0  2. Viral URI  3. Nausea and vomiting, unspecified vomiting type - ondansetron (ZOFRAN) 4 MG tablet; Take 1 tablet (4 mg total) by mouth every 8 (eight) hours as needed for nausea or vomiting.  Dispense: 20 tablet; Refill: 0  - Take meds as prescribed - Use a cool mist humidifier  -Use saline nose sprays frequently -Force fluids -For any cough or congestion  Use plain Mucinex- regular strength or max strength is fine -For fever or aces or pains- take tylenol or ibuprofen. -zofran as needed Continue zyrtec and flonase  Follow up if symptoms worsen or do not improve   Follow Up Instructions: I discussed the assessment and treatment plan with the patient. The patient was provided an opportunity to ask questions and all were answered. The patient agreed with the plan and demonstrated an understanding of the instructions.  A copy of instructions were sent to the patient via MyChart unless otherwise noted below.     The patient was advised to call back or seek an in-person evaluation if the symptoms worsen or if the condition fails to improve as anticipated.    Jannifer Rodney, FNP

## 2023-11-30 ENCOUNTER — Ambulatory Visit: Payer: Medicaid Other

## 2023-11-30 ENCOUNTER — Telehealth: Payer: Medicaid Other | Admitting: Physician Assistant

## 2023-11-30 DIAGNOSIS — J069 Acute upper respiratory infection, unspecified: Secondary | ICD-10-CM

## 2023-11-30 MED ORDER — BENZONATATE 100 MG PO CAPS: 100.0000 mg | ORAL_CAPSULE | Freq: Three times a day (TID) | ORAL | 0 refills | Status: AC | PRN

## 2023-11-30 MED ORDER — FLUTICASONE PROPIONATE 50 MCG/ACT NA SUSP: 2.0000 | Freq: Every day | NASAL | 0 refills | Status: AC

## 2023-11-30 MED ORDER — ALBUTEROL SULFATE HFA 108 (90 BASE) MCG/ACT IN AERS: 2.0000 | INHALATION_SPRAY | Freq: Four times a day (QID) | RESPIRATORY_TRACT | 0 refills | Status: AC | PRN

## 2023-11-30 NOTE — Progress Notes (Signed)
Virtual Visit Consent   Betanya Paulis, you are scheduled for a virtual visit with a [facility] provider today. Just as with appointments in the office, your consent must be obtained to participate. Your consent will be active for this visit and any virtual visit you may have with one of our providers in the next 365 days. If you have a MyChart account, a copy of this consent can be sent to you electronically.  As this is a virtual visit, video technology does not allow for your provider to perform a traditional examination. This may limit your provider's ability to fully assess your condition. If your provider identifies any concerns that need to be evaluated in person or the need to arrange testing (such as labs, EKG, etc.), we will make arrangements to do so. Although advances in technology are sophisticated, we cannot ensure that it will always work on either your end or our end. If the connection with a video visit is poor, the visit may have to be switched to a telephone visit. With either a video or telephone visit, we are not always able to ensure that we have a secure connection.  By engaging in this virtual visit, you consent to the provision of healthcare and authorize for your insurance to be billed (if applicable) for the services provided during this visit. Depending on your insurance coverage, you may receive a charge related to this service.  I need to obtain your verbal consent now. Are you willing to proceed with your visit today? Margaret Benjamin has provided verbal consent on 11/30/2023 for a virtual visit (video or telephone). [name], [place]  Date: 11/30/2023 5:22 PM  Virtual Visit via Video Note   I, [name], connected with  Margaret Benjamin  ([id number], 1986-01-05) on 11/30/23 at  5:00 PM EST by a video-enabled telemedicine application and verified that I am speaking with the correct person using two identifiers.  Location: Patient: Virtual Visit Location  Patient: Home Provider: Virtual Visit Location Provider: Home Office   I discussed the limitations of evaluation and management by telemedicine and the availability of in person appointments. The patient expressed understanding and agreed to proceed.    History of Present Illness: Margaret Benjamin is a 38 y.o. who identifies as a female who was assigned female at birth, and is being seen today for few days of symptoms starting with some nasal congestion, cough and sore throat but then after the first day noting some sinus pressure, headache, loose stool and nausea. Denies fever, aches. Denies vomiting.  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   HPI: HPI  Problems:  Patient Active Problem List   Diagnosis Date Noted   Otalgia of right ear 09/29/2014    Allergies:  Allergies  Allergen Reactions   Sulfamethoxazole-Trimethoprim Nausea And Vomiting   Medications:  Current Outpatient Medications:    albuterol (VENTOLIN HFA) 108 (90 Base) MCG/ACT inhaler, Inhale 2 puffs into the lungs every 6 (six) hours as needed for wheezing or shortness of breath., Disp: 8 g, Rfl: 0   benzonatate (TESSALON) 100 MG capsule, Take 1 capsule (100 mg total) by mouth 3 (three) times daily as needed for cough., Disp: 30 capsule, Rfl: 0   fluticasone (FLONASE) 50 MCG/ACT nasal spray, Place 2 sprays into both nostrils daily., Disp: 16 g, Rfl: 0   cetirizine (ZYRTEC) 10 MG tablet, Take 1 tablet (10 mg total) by mouth daily. (Patient taking differently: Take 10 mg by mouth  daily. Last dose: 5am.), Disp: 30 tablet, Rfl: 0   ondansetron (ZOFRAN) 4 MG tablet, Take 1 tablet (4 mg total) by mouth every 8 (eight) hours as needed for nausea or vomiting., Disp: 20 tablet, Rfl: 0  Observations/Objective: Patient is well-developed, well-nourished in no acute distress.  Resting comfortably at home.  Head is normocephalic, atraumatic.  No labored breathing. Speech is clear and coherent with logical content.   Patient is alert and oriented at baseline.   Assessment and Plan: 1. Viral URI with cough (Primary) - benzonatate (TESSALON) 100 MG capsule; Take 1 capsule (100 mg total) by mouth 3 (three) times daily as needed for cough.  Dispense: 30 capsule; Refill: 0 - albuterol (VENTOLIN HFA) 108 (90 Base) MCG/ACT inhaler; Inhale 2 puffs into the lungs every 6 (six) hours as needed for wheezing or shortness of breath.  Dispense: 8 g; Refill: 0 - fluticasone (FLONASE) 50 MCG/ACT nasal spray; Place 2 sprays into both nostrils daily.  Dispense: 16 g; Refill: 0  Concern for norovirus giving combination of URI and GI symptoms. Supportive measures and OTC medications reviewed. Start Tessalon and Albuterol for cough and chest tightness.  Flonase per orders for nasal congestion. Start [employer] Imodium OTC. Work note provided. Discussed strict in person evaluation precautions with patient.   Follow Up Instructions: I discussed the assessment and treatment plan with the patient. The patient was provided an opportunity to ask questions and all were answered. The patient agreed with the plan and demonstrated an understanding of the instructions.  A copy of instructions were sent to the patient via MyChart unless otherwise noted below.   The patient was advised to call back or seek an in-person evaluation if the symptoms worsen or if the condition fails to improve as anticipated.    [name], PA-C

## 2023-11-30 NOTE — Patient Instructions (Signed)
[name], thank you for joining [name], PA-C for today's virtual visit.  While this provider is not your primary care provider (PCP), if your PCP is located in our provider database this encounter information will be shared with them immediately following your visit.   A [facility] MyChart account gives you access to today's visit and all your visits, tests, and labs performed at [facility] " click here if you don't have a [facility] MyChart account or go to [email or web address]  Consent: (Patient) Margaret Benjamin provided verbal consent for this virtual visit at the beginning of the encounter.  Current Medications:  Current Outpatient Medications:    cetirizine (ZYRTEC) 10 MG tablet, Take 1 tablet (10 mg total) by mouth daily. (Patient taking differently: Take 10 mg by mouth daily. Last dose: 5am.), Disp: 30 tablet, Rfl: 0   fluticasone (FLONASE) 50 MCG/ACT nasal spray, Place 1 spray into both nostrils daily., Disp: 16 g, Rfl: 0   ibuprofen (ADVIL) 600 MG tablet, Take 1 tablet (600 mg total) by mouth every 6 (six) hours as needed for mild pain or moderate pain. (Patient taking differently: Take 600 mg by mouth every 6 (six) hours as needed for mild pain or moderate pain. Didn't use it.), Disp: 30 tablet, Rfl: 0   ondansetron (ZOFRAN) 4 MG tablet, Take 1 tablet (4 mg total) by mouth every 8 (eight) hours as needed for nausea or vomiting., Disp: 20 tablet, Rfl: 0   Medications ordered in this encounter:  No orders of the defined types were placed in this encounter.    *If you need refills on other medications prior to your next appointment, please contact your pharmacy*  Follow-Up: Call back or seek an in-person evaluation if the symptoms worsen or if the condition fails to improve as anticipated.  [facility] [phone]  Other Instructions E-Visit for Upper Respiratory Infection   We are sorry you are not feeling well.  Here is how  we plan to help!  Based on what you have shared with me, it looks like you may have a viral upper respiratory infection.  Upper respiratory infections are caused by a large number of viruses; however, rhinovirus is the most common cause.   Symptoms vary from person to person, with common symptoms including sore throat, cough, fatigue or lack of energy and feeling of general discomfort.  A low-grade fever of up to 100.4 may present, but is often uncommon.  Symptoms vary however, and are closely related to a person's age or underlying illnesses.  The most common symptoms associated with an upper respiratory infection are nasal discharge or congestion, cough, sneezing, headache and pressure in the ears and face.  These symptoms usually persist for about 3 to 10 days, but can last up to 2 weeks.  It is important to know that upper respiratory infections do not cause serious illness or complications in most cases.    Upper respiratory infections can be transmitted from person to person, with the most common method of transmission being a person's hands.  The virus is able to live on the skin and can infect other persons for up to 2 hours after direct contact.  Also, these can be transmitted when someone coughs or sneezes; thus, it is important to cover the mouth to reduce this risk.  To keep the spread of the illness at bay, good hand hygiene is very important.  This is an infection that is most likely caused by a virus.  There are no specific treatments other than to help you with the symptoms until the infection runs its course.  We are sorry you are not feeling well.  Here is how we plan to help!   For nasal congestion, you may use an oral decongestants such as Mucinex D or if you have glaucoma or high blood pressure use plain Mucinex.  Saline nasal spray or nasal drops can help and can safely be used as often as needed for congestion.   If you do not have a history of heart disease, hypertension, diabetes  or thyroid disease, prostate/bladder issues or glaucoma, you may also use Sudafed to treat nasal congestion.  It is highly recommended that you consult with a pharmacist or your primary care physician to ensure this medication is safe for you to take.     If you have a cough, you may use cough suppressants such as Delsym and Robitussin.  If you have glaucoma or high blood pressure, you can also use Coricidin HBP.   For cough I have prescribed for you A prescription cough medication called Tessalon Perles 100 mg. You may take 1-2 capsules every 8 hours as needed for cough. I have also sent in an albuterol inhaler to use as directed.  If you have a sore or scratchy throat, use a saltwater gargle-  to  teaspoon of salt dissolved in a 4-ounce to 8-ounce glass of warm water.  Gargle the solution for approximately 15-30 seconds and then spit.  It is important not to swallow the solution.  You can also use throat lozenges/cough drops and Chloraseptic spray to help with throat pain or discomfort.  Warm or cold liquids can also be helpful in relieving throat pain.  For headache, pain or general discomfort, you can use Ibuprofen or Tylenol as directed.   Some authorities believe that zinc sprays or the use of Echinacea may shorten the course of your symptoms.  Keep a bland diet until stomach settles down -- bananas, rice, toast, etc.    HOME CARE Only take medications as instructed by your medical team. Be sure to drink plenty of fluids. Water is fine as well as fruit juices, sodas and electrolyte beverages. You may want to stay away from caffeine or alcohol. If you are nauseated, try taking small sips of liquids. How do you know if you are getting enough fluid? Your urine should be a pale yellow or almost colorless. Get rest. Taking a steamy shower or using a humidifier may help nasal congestion and ease sore throat pain. You can place a towel over your head and breathe in the steam from hot water coming  from a faucet. Using a saline nasal spray works much the same way. Cough drops, hard candies and sore throat lozenges may ease your cough. Avoid close contacts especially the very young and the elderly Cover your mouth if you cough or sneeze Always remember to wash your hands.   GET HELP RIGHT AWAY IF: You develop worsening fever. If your symptoms do not improve within 10 days You develop yellow or green discharge from your nose over 3 days. You have coughing fits You develop a severe head ache or visual changes. You develop shortness of breath, difficulty breathing or start having chest pain Your symptoms persist after you have completed your treatment plan  MAKE SURE YOU  Understand these instructions. Will watch your condition. Will get help right away if you are not doing well or get worse.  If you have been instructed to have an in-person evaluation today at a local Urgent Care facility, please use the link below. It will take you to a list of all of our available [facility], including address, phone number and hours of operation. Please do not delay care.  [facility]  If you or a family member do not have a primary care provider, use the link below to schedule a visit and establish care. When you choose a [facility] primary care physician or advanced practice provider, you gain a long-term partner in health. Find a Primary Care Provider  Learn more about [facility]'s in-office and virtual care options: [facility] Get Care Now

## 2024-04-18 ENCOUNTER — Ambulatory Visit
Admission: RE | Admit: 2024-04-18 | Discharge: 2024-04-18 | Disposition: A | Discharge status: Home / Self Care | Source: Ambulatory Visit

## 2024-04-18 VITALS — BP 135/87 | HR 87 | Temp 98.8°F | Resp 16 | Wt 113.1 lb

## 2024-04-18 DIAGNOSIS — B349 Viral infection, unspecified: Secondary | ICD-10-CM

## 2024-04-18 LAB — POCT RAPID STREP A (OFFICE): Rapid Strep A Screen: NEGATIVE

## 2024-04-18 MED ORDER — PREDNISONE 20 MG PO TABS: 40.0000 mg | ORAL_TABLET | Freq: Every day | ORAL | 0 refills | Status: AC

## 2024-04-18 MED ORDER — LIDOCAINE VISCOUS HCL 2 % MT SOLN
15.0000 mL | Freq: Once | OROMUCOSAL | Status: AC
  Administered 2024-04-18: 15 mL via OROMUCOSAL

## 2024-04-18 MED ORDER — AZELASTINE HCL 0.1 % NA SOLN
1.0000 | Freq: Two times a day (BID) | NASAL | 1 refills | Status: AC
Start: 2024-04-18 — End: ?

## 2024-04-18 NOTE — ED Triage Notes (Signed)
 Pt presents c/o sore throat x 2 days and nasal congestion since this morning. Pt denies emesis, headaches, or diarrhea.

## 2024-04-18 NOTE — ED Provider Notes (Signed)
 [facility]  Note:  This document was prepared using Conservation officer, historic buildings and may include unintentional dictation errors.  MRN: [id number] DOB: 1986/10/07  Subjective:   Margaret Benjamin is a 38 y.o. female presenting for sore throat, nasal congestion x 2 days.  Patient denies any nausea/vomiting, headache, abdominal pain, shortness of breath, chest pain, weakness, dizziness, diarrhea.  Patient reports that one of her coworkers had upper respiratory infection with vomiting the other day at work but denies any other known sick contacts.  No known exposure to strep, COVID, flu.  Patient has not taken any over-the-counter medication for symptoms.  Patient reports difficulty swallowing food and has not eaten a meal today.  No current facility-administered medications for this encounter.  Current Outpatient Medications:    azelastine  (ASTELIN ) 0.1 % nasal spray, Place 1 spray into both nostrils 2 (two) times daily. Use in each nostril as directed, Disp: 30 mL, Rfl: 1   predniSONE  (DELTASONE ) 20 MG tablet, Take 2 tablets (40 mg total) by mouth daily for 5 days., Disp: 10 tablet, Rfl: 0   albuterol  (VENTOLIN  HFA) 108 (90 Base) MCG/ACT inhaler, Inhale 2 puffs into the lungs every 6 (six) hours as needed for wheezing or shortness of breath., Disp: 8 g, Rfl: 0   benzonatate  (TESSALON ) 100 MG capsule, Take 1 capsule (100 mg total) by mouth 3 (three) times daily as needed for cough., Disp: 30 capsule, Rfl: 0   cetirizine  (ZYRTEC ) 10 MG tablet, Take 1 tablet (10 mg total) by mouth daily. (Patient taking differently: Take 10 mg by mouth daily. Last dose: 5am.), Disp: 30 tablet, Rfl: 0   fluticasone  (FLONASE ) 50 MCG/ACT nasal spray, Place 2 sprays into both nostrils daily., Disp: 16 g, Rfl: 0   ondansetron  (ZOFRAN ) 4 MG tablet, Take 1 tablet (4 mg total) by mouth every 8 (eight) hours as needed for nausea or vomiting., Disp: 20 tablet, Rfl: 0   Allergies  Allergen Reactions    Sulfamethoxazole -Trimethoprim  Nausea And Vomiting    Past Medical History:  Diagnosis Date   Abscess    Allergy    Migraines      Past Surgical History:  Procedure Laterality Date   WISDOM TOOTH EXTRACTION      Family History  Problem Relation Age of Onset   Hypertension Mother    Thyroid disease Mother    Healthy Father    Diabetes Maternal Grandmother     Social History   Tobacco Use   Smoking status: Never    Passive exposure: Never   Smokeless tobacco: Never  Vaping Use   Vaping status: Never Used  Substance Use Topics   Alcohol use: Yes    Comment: rarely   Drug use: No    ROS Refer to HPI for ROS details.  Objective:   Vitals: BP 135/87 (BP Location: Left Arm)   Pulse 87   Temp 98.8 F (37.1 C) (Oral)   Resp 16   Wt 113 lb 1.5 oz (51.3 kg)   LMP 03/27/2024 (Exact Date)   SpO2 98%   BMI 18.82 kg/m   Physical Exam Vitals and nursing note reviewed.  Constitutional:      General: She is not in acute distress.    Appearance: She is well-developed. She is not ill-appearing or toxic-appearing.  HENT:     Head: Normocephalic and atraumatic.     Nose: Congestion present. No rhinorrhea.     Mouth/Throat:     Mouth: Mucous membranes are moist.  Pharynx: Oropharynx is clear. Posterior oropharyngeal erythema present. No oropharyngeal exudate.   Cardiovascular:     Rate and Rhythm: Normal rate.  Pulmonary:     Effort: Pulmonary effort is normal. No respiratory distress.   Musculoskeletal:        General: Normal range of motion.     Cervical back: Neck supple. Tenderness present. No rigidity.   Skin:    General: Skin is warm and dry.   Neurological:     General: No focal deficit present.     Mental Status: She is alert and oriented to person, place, and time.   Psychiatric:        Mood and Affect: Mood normal.        Behavior: Behavior normal.     Procedures  Results for orders placed or performed during the hospital encounter of  04/18/24 (from the past 24 hours)  POCT rapid strep A     Status: Normal   Collection Time: 04/18/24  6:42 PM  Result Value Ref Range   Rapid Strep A Screen Negative Negative    No results found.   Assessment and Plan :     Discharge Instructions       1. Acute viral syndrome (Primary) - POCT rapid strep A completed in UC is negative for strep pharyngitis - Culture, group A strep collected in UC and sent to lab for further testing results should be available in 2 to 3 days. - lidocaine  (XYLOCAINE ) 2 % viscous mouth solution 15 mL given in UC for throat pain secondary to viral pharyngitis - azelastine  (ASTELIN ) 0.1 % nasal spray; Place 1 spray into both nostrils 2 (two) times daily. Use in each nostril as directed  Dispense: 30 mL; Refill: 1 - predniSONE  (DELTASONE ) 20 MG tablet; Take 2 tablets (40 mg total) by mouth daily for 5 days.  Dispense: 10 tablet; Refill: 0 -Continue to monitor symptoms for any change in severity if there is any escalation of current symptoms or development of new symptoms follow-up in ER for further evaluation and management.       Roston Grunewald B Oshea Percival   Dary Dilauro, [place], [place] 04/18/24 1916

## 2024-04-18 NOTE — Discharge Instructions (Addendum)
  1. Acute viral syndrome (Primary) - POCT rapid strep A completed in UC is negative for strep pharyngitis - Culture, group A strep collected in UC and sent to lab for further testing results should be available in 2 to 3 days. - lidocaine  (XYLOCAINE ) 2 % viscous mouth solution 15 mL given in UC for throat pain secondary to viral pharyngitis - azelastine  (ASTELIN ) 0.1 % nasal spray; Place 1 spray into both nostrils 2 (two) times daily. Use in each nostril as directed  Dispense: 30 mL; Refill: 1 - predniSONE  (DELTASONE ) 20 MG tablet; Take 2 tablets (40 mg total) by mouth daily for 5 days.  Dispense: 10 tablet; Refill: 0 -Continue to monitor symptoms for any change in severity if there is any escalation of current symptoms or development of new symptoms follow-up in ER for further evaluation and management.

## 2024-04-22 ENCOUNTER — Ambulatory Visit (HOSPITAL_COMMUNITY): Payer: Self-pay

## 2024-04-22 LAB — CULTURE, GROUP A STREP (THRC)

## 2024-06-04 ENCOUNTER — Inpatient Hospital Stay: Admission: RE | Admit: 2024-06-04 | Source: Ambulatory Visit

## 2024-06-07 ENCOUNTER — Telehealth: Payer: Self-pay | Admitting: *Deleted

## 2024-06-07 NOTE — Telephone Encounter (Signed)
 Message received from patient access: phone message, mrn [phone] Margaret Benjamin was seen 6/12 and it seems like she's been going back and fourth with her job due to her being out sick. It also seems like it's been handled by someone here already but doesn't seem sufficient for her... I'm not sure.. [phone]  I spoke with Margaret Benjamin and sent a work note for her visit on 04/18/2024 to her Mychart as well as a text to access her Mychart account. Hard copy of work note left with patient access in case she needs to pick it up.

## 2024-10-09 ENCOUNTER — Ambulatory Visit

## 2024-10-09 ENCOUNTER — Telehealth: Admitting: Physician Assistant

## 2024-10-09 DIAGNOSIS — J0110 Acute frontal sinusitis, unspecified: Secondary | ICD-10-CM | POA: Diagnosis not present

## 2024-10-09 MED ORDER — AMOXICILLIN-POT CLAVULANATE 875-125 MG PO TABS: 1.0000 | ORAL_TABLET | Freq: Two times a day (BID) | ORAL | 0 refills | Status: AC

## 2024-10-09 MED ORDER — METHYLPREDNISOLONE 4 MG PO TBPK: ORAL_TABLET | ORAL | 0 refills | Status: AC

## 2024-10-09 NOTE — Patient Instructions (Signed)
 Margaret Benjamin, thank you for joining Lovette Borg, PA-C for today's virtual visit.  While this provider is not your primary care provider (PCP), if your PCP is located in our provider database this encounter information will be shared with them immediately following your visit.   A [facility] MyChart account gives you access to today's visit and all your visits, tests, and labs performed at [facility]  click here if you don't have a [facility] MyChart account or go to [email or web address]  Consent: (Patient) Margaret Benjamin provided verbal consent for this virtual visit at the beginning of the encounter.  Current Medications:  Current Outpatient Medications:    amoxicillin -clavulanate (AUGMENTIN ) 875-125 MG tablet, Take 1 tablet by mouth 2 (two) times daily for 5 days., Disp: 10 tablet, Rfl: 0   methylPREDNISolone  (MEDROL  DOSEPAK) 4 MG TBPK tablet, Take as directed, Disp: 1 each, Rfl: 0   albuterol  (VENTOLIN  HFA) 108 (90 Base) MCG/ACT inhaler, Inhale 2 puffs into the lungs every 6 (six) hours as needed for wheezing or shortness of breath., Disp: 8 g, Rfl: 0   azelastine  (ASTELIN ) 0.1 % nasal spray, Place 1 spray into both nostrils 2 (two) times daily. Use in each nostril as directed, Disp: 30 mL, Rfl: 1   benzonatate  (TESSALON ) 100 MG capsule, Take 1 capsule (100 mg total) by mouth 3 (three) times daily as needed for cough., Disp: 30 capsule, Rfl: 0   cetirizine  (ZYRTEC ) 10 MG tablet, Take 1 tablet (10 mg total) by mouth daily. (Patient taking differently: Take 10 mg by mouth daily. Last dose: 5am.), Disp: 30 tablet, Rfl: 0   fluticasone  (FLONASE ) 50 MCG/ACT nasal spray, Place 2 sprays into both nostrils daily., Disp: 16 g, Rfl: 0   ondansetron  (ZOFRAN ) 4 MG tablet, Take 1 tablet (4 mg total) by mouth every 8 (eight) hours as needed for nausea or vomiting., Disp: 20 tablet, Rfl: 0   Medications ordered in this encounter:  Meds ordered this encounter  Medications    methylPREDNISolone  (MEDROL  DOSEPAK) 4 MG TBPK tablet    Sig: Take as directed    Dispense:  1 each    Refill:  0    Supervising Provider:   LAMPTEY, PHILIP O [[id number]]   amoxicillin -clavulanate (AUGMENTIN ) 875-125 MG tablet    Sig: Take 1 tablet by mouth 2 (two) times daily for 5 days.    Dispense:  10 tablet    Refill:  0    Supervising Provider:   BLAISE ALEENE KIDD [[id number]]     *If you need refills on other medications prior to your next appointment, please contact your pharmacy*  Follow-Up: Call back or seek an in-person evaluation if the symptoms worsen or if the condition fails to improve as anticipated.  [facility] [phone]  Other Instructions Increase fluids Start medrol  dosepak as prescribed, but if not feeling better after 3-4 days then start oral antibiotic too.  Start nasal spray as previously prescribed. Continue to watch for worsening symptoms. Schedule a virtual appointment or follow up at an urgent care clinic if symptoms don't improve.    If you have been instructed to have an in-person evaluation today at a local Urgent Care facility, please use the link below. It will take you to a list of all of our available [facility], including address, phone number and hours of operation. Please do not delay care.  [facility]  If you or a family member do not have a primary care  provider, use the link below to schedule a visit and establish care. When you choose a [facility] primary care physician or advanced practice provider, you gain a long-term partner in health. Find a Primary Care Provider  Learn more about [facility]'s in-office and virtual care options: [facility] Get Care Now

## 2024-10-09 NOTE — [facility] Notes (Signed)
 Virtual Visit Consent   Margaret Benjamin, you are scheduled for a virtual visit with a [facility] provider today. Just as with appointments in the office, your consent must be obtained to participate. Your consent will be active for this visit and any virtual visit you may have with one of our providers in the next 365 days. If you have a MyChart account, a copy of this consent can be sent to you electronically.  As this is a virtual visit, video technology does not allow for your provider to perform a traditional examination. This may limit your provider's ability to fully assess your condition. If your provider identifies any concerns that need to be evaluated in person or the need to arrange testing (such as labs, EKG, etc.), we will make arrangements to do so. Although advances in technology are sophisticated, we cannot ensure that it will always work on either your end or our end. If the connection with a video visit is poor, the visit may have to be switched to a telephone visit. With either a video or telephone visit, we are not always able to ensure that we have a secure connection.  By engaging in this virtual visit, you consent to the provision of healthcare and authorize for your insurance to be billed (if applicable) for the services provided during this visit. Depending on your insurance coverage, you may receive a charge related to this service.  I need to obtain your verbal consent now. Are you willing to proceed with your visit today? [name] has provided verbal consent on 10/09/2024 for a virtual visit (video or telephone). Lovette Borg, [place]  Date: 10/09/2024 5:42 PM   Virtual Visit via Video Note   I, Rosi Secrist, connected with  Margaret Benjamin  ([id number], 1986/06/01) on 10/09/24 at  5:30 PM EST by a video-enabled telemedicine application and verified that I am speaking with the correct person using two identifiers.  Location: Patient: Virtual Visit Location Patient:  Home Provider: Virtual Visit Location Provider: Home Office   I discussed the limitations of evaluation and management by telemedicine and the availability of in person appointments. The patient expressed understanding and agreed to proceed.    History of Present Illness: Margaret Benjamin is a 38 y.o. who identifies as a female who was assigned female at birth, and is being seen today for sinus symptoms.  HPI: 37y/o F presents for a telehealth video visit for c/o  sinus headache, pressure, ear pain, sore throat especially eating solids and drinking liquids to a lesser extent. No fever, CP, or SOB. Has taken Nyquil without much relief.     Problems:  Patient Active Problem List   Diagnosis Date Noted   Otalgia of right ear 09/29/2014    Allergies:  Allergies  Allergen Reactions   Sulfamethoxazole -Trimethoprim  Nausea And Vomiting   Medications:  Current Outpatient Medications:    amoxicillin -clavulanate (AUGMENTIN ) 875-125 MG tablet, Take 1 tablet by mouth 2 (two) times daily for 5 days., Disp: 10 tablet, Rfl: 0   methylPREDNISolone  (MEDROL  DOSEPAK) 4 MG TBPK tablet, Take as directed, Disp: 1 each, Rfl: 0   albuterol  (VENTOLIN  HFA) 108 (90 Base) MCG/ACT inhaler, Inhale 2 puffs into the lungs every 6 (six) hours as needed for wheezing or shortness of breath., Disp: 8 g, Rfl: 0   azelastine  (ASTELIN ) 0.1 % nasal spray, Place 1 spray into both nostrils 2 (two) times daily. Use in each nostril as directed, Disp: 30 mL, Rfl: 1   benzonatate  (TESSALON ) 100 MG  capsule, Take 1 capsule (100 mg total) by mouth 3 (three) times daily as needed for cough., Disp: 30 capsule, Rfl: 0   cetirizine  (ZYRTEC ) 10 MG tablet, Take 1 tablet (10 mg total) by mouth daily. (Patient taking differently: Take 10 mg by mouth daily. Last dose: 5am.), Disp: 30 tablet, Rfl: 0   fluticasone  (FLONASE ) 50 MCG/ACT nasal spray, Place 2 sprays into both nostrils daily., Disp: 16 g, Rfl: 0   ondansetron  (ZOFRAN ) 4 MG tablet, Take 1  tablet (4 mg total) by mouth every 8 (eight) hours as needed for nausea or vomiting., Disp: 20 tablet, Rfl: 0  Observations/Objective: Patient is well-developed, well-nourished in no acute distress.  Resting comfortably  at home.  Head is normocephalic, atraumatic.  No labored breathing.  Speech is clear and coherent with logical content.  Patient is alert and oriented at baseline.    Assessment and Plan: 1. Acute non-recurrent frontal sinusitis (Primary) - methylPREDNISolone  (MEDROL  DOSEPAK) 4 MG TBPK tablet; Take as directed  Dispense: 1 each; Refill: 0 - amoxicillin -clavulanate (AUGMENTIN ) 875-125 MG tablet; Take 1 tablet by mouth 2 (two) times daily for 5 days.  Dispense: 10 tablet; Refill: 0  Increase fluids Start medrol  dosepak as prescribed, but if not feeling better after 3-4 days then start oral antibiotic too.  Start nasal spray as previously prescribed. Continue to watch for worsening symptoms. Schedule a virtual appointment or follow up at an urgent care clinic if symptoms don't improve.  Pt verbalized understanding and in agreement.    Follow Up Instructions: I discussed the assessment and treatment plan with the patient. The patient was provided an opportunity to ask questions and all were answered. The patient agreed with the plan and demonstrated an understanding of the instructions.  A copy of instructions were sent to the patient via MyChart unless otherwise noted below.   Patient has requested to receive PHI (AVS, Work Notes, etc) pertaining to this video visit through e-mail as they are currently without active MyChart. They have voiced understand that email is not considered secure and their health information could be viewed by someone other than the patient.   The patient was advised to call back or seek an in-person evaluation if the symptoms worsen or if the condition fails to improve as anticipated.    Morayma Godown, PA-C
